# Patient Record
Sex: Female | Born: 1950 | Race: White | Hispanic: No | Marital: Single
Health system: Midwestern US, Community
[De-identification: ages and names within clinical notes are randomized; demographics above are authoritative.]

## PROBLEM LIST (undated history)

## (undated) DIAGNOSIS — S83209A Unspecified tear of unspecified meniscus, current injury, unspecified knee, initial encounter: Secondary | ICD-10-CM

## (undated) DIAGNOSIS — G4730 Sleep apnea, unspecified: Secondary | ICD-10-CM

## (undated) DIAGNOSIS — G629 Polyneuropathy, unspecified: Secondary | ICD-10-CM

## (undated) DIAGNOSIS — T148XXA Other injury of unspecified body region, initial encounter: Secondary | ICD-10-CM

## (undated) DIAGNOSIS — R251 Tremor, unspecified: Secondary | ICD-10-CM

## (undated) HISTORY — PX: ABDOMINAL SURGERY: SHX537

## (undated) HISTORY — DX: Tremor, unspecified: R25.1

## (undated) HISTORY — PX: KIDNEY STONE SURGERY: SHX686

## (undated) HISTORY — DX: Sleep apnea, unspecified: G47.30

## (undated) HISTORY — DX: Other injury of unspecified body region, initial encounter: T14.8XXA

## (undated) HISTORY — DX: Unspecified tear of unspecified meniscus, current injury, unspecified knee, initial encounter: S83.209A

## (undated) HISTORY — DX: Polyneuropathy, unspecified: G62.9

---

## 2015-06-15 NOTE — ED Provider Notes (Signed)
PATIENT:          Wendy Middleton, Wendy Middleton       DOS:           06/15/2015  MR #:             [id number]             ACCOUNT #:     [id number]  DATE OF BIRTH:    September 12, 1951              AGE:           63      HISTORY OF PRESENT ILLNESS:    PERTINENT HISTORY OF PRESENT  ILLNESS. Patient presents with a  chief  complaint of decreased hearing in both her ears, she thinks that she  has wax  impacted in her ears.  She tried debrox drops earlier today.  [place] and has been traveling for a golf tournament and that is why  she is in  this area.    PERTINENT PAST/ FAMILY/SOCIAL HISTORY Essential tremor        PHYSICAL EXAM General: No acute distress, vital signs within normal  limits,  afebrile  HEENT: Normocephalic and atraumatic, pupils are equal, round and  reactive to  light, EOMI, moist mucous membranes, bilateral ears are impacted with  cerumen  CVS: Regular rate and rhythm, S1-S2  Respiratory: CTA bilaterally, no distress  Abdomen: Soft, nontender, nondistended, normal bowel sounds  Extremities: 2+ DP PT pulses bilaterally and symmetrically, nontender,  no edema  neuro: Alert and oriented x3, no focal deficits, patient does have an  essential  tremor in bilateral upper extremities    MEDICAL DECISION MAKING:    SIGNIFICANT FINDINGS/ED COURSE/MEDICAL DECISION MAKING/TREATMENT  PLAN The  cerumen impaction was removed from the left ear without any difficulty  and  patient had complete relief of her symptoms on the side.  On the right  side,  the cerumen was much harder in nature, and irrigation was performed,  most of it  was removed with a curette.  Patient had relief of her symptoms.  She  is not  from this area, she stated that when she gets back home she will  follow-up with  her doctor.  I instructed her to use debrox drops as well as needed  she is  agreeable with this plan and she was discharged in stable condition.    PROBLEM LIST:       Admit Reason:     Bilat Ear Pain: Entered Date:  15-Jun-2015 16:55, Entered By:  Standley BrookingINTERFACES,  INTERFACES, Status: Active        DIAGNOSIS Bilateral cerumen impaction      COPIES SENT TO::     NO, PCP DOCTOR(PCP): [id number]    Electronic Signatures:  Hildred AlaminBILGEN, Ashlyne Olenick (MD)  (Signed 15-Jun-2015 17:43)   Authored: HISTORY OF PRESENT ILLNESS, PHYSICAL EXAM, MEDICAL DECISION  MAKING,  PROBLEM LIST, DIAGNOSIS, Copies to be sent to:      Last Updated: 15-Jun-2015 17:43 by Hildred AlaminBILGEN, Aries Kasa (MD)            Please see T-Sheet, initial assessment, and physician orders for  further details.    Dictating Physician: Hildred Alaminzlem Telina Kleckley, MD  Original Electronic Signature Date: 06/15/2015 05:43 P  OB  Document #: [id number]    cc:  PCP No       [place]

## 2015-12-20 ENCOUNTER — Other Ambulatory Visit (HOSPITAL_COMMUNITY)
Admission: RE | Admit: 2015-12-20 | Discharge: 2015-12-20 | Disposition: A | Discharge status: Home / Self Care | Payer: Commercial Managed Care - PPO | Source: Ambulatory Visit | Attending: Family Medicine | Admitting: Family Medicine

## 2015-12-20 ENCOUNTER — Other Ambulatory Visit: Payer: Self-pay | Admitting: Family Medicine

## 2015-12-20 DIAGNOSIS — Z01411 Encounter for gynecological examination (general) (routine) with abnormal findings: Secondary | ICD-10-CM | POA: Diagnosis present

## 2015-12-20 DIAGNOSIS — Z1151 Encounter for screening for human papillomavirus (HPV): Secondary | ICD-10-CM | POA: Insufficient documentation

## 2015-12-23 LAB — CYTOLOGY - PAP

## 2016-01-03 ENCOUNTER — Other Ambulatory Visit: Payer: Self-pay | Admitting: Obstetrics and Gynecology

## 2016-01-23 ENCOUNTER — Other Ambulatory Visit: Payer: Self-pay | Admitting: Obstetrics and Gynecology

## 2016-07-04 ENCOUNTER — Other Ambulatory Visit: Payer: Self-pay | Admitting: Obstetrics and Gynecology

## 2016-07-04 ENCOUNTER — Other Ambulatory Visit (HOSPITAL_COMMUNITY)
Admission: RE | Admit: 2016-07-04 | Discharge: 2016-07-04 | Disposition: A | Discharge status: Home / Self Care | Payer: Commercial Managed Care - PPO | Source: Ambulatory Visit | Attending: Obstetrics and Gynecology | Admitting: Obstetrics and Gynecology

## 2016-07-04 DIAGNOSIS — Z01411 Encounter for gynecological examination (general) (routine) with abnormal findings: Secondary | ICD-10-CM | POA: Insufficient documentation

## 2016-07-04 DIAGNOSIS — Z1151 Encounter for screening for human papillomavirus (HPV): Secondary | ICD-10-CM | POA: Insufficient documentation

## 2016-07-09 LAB — CYTOLOGY - PAP

## 2016-12-26 ENCOUNTER — Encounter: Payer: Self-pay | Admitting: Diagnostic Neuroimaging

## 2016-12-26 ENCOUNTER — Ambulatory Visit (INDEPENDENT_AMBULATORY_CARE_PROVIDER_SITE_OTHER): Payer: Commercial Managed Care - PPO | Admitting: Diagnostic Neuroimaging

## 2016-12-26 VITALS — BP 103/74 | HR 79 | Ht 68.0 in | Wt 210.0 lb

## 2016-12-26 DIAGNOSIS — G250 Essential tremor: Secondary | ICD-10-CM | POA: Diagnosis not present

## 2016-12-26 MED ORDER — PRIMIDONE 50 MG PO TABS: 25.0000 mg | ORAL_TABLET | Freq: Two times a day (BID) | ORAL | 6 refills | Status: DC

## 2016-12-26 NOTE — Progress Notes (Signed)
[facility]  PATIENT: Susan Norton DOB: Mar 04, 1951  REFERRING CLINICIAN: Edwin Dada HISTORY FROM: patient  REASON FOR VISIT: new consult    HISTORICAL  CHIEF COMPLAINT:  Chief Complaint  Patient presents with  . Tremors    rm 6, New Pt, "tremors in hands x 20 years, runs in my family"    HISTORY OF PRESENT ILLNESS:   66 year old female here for evaluation of tremor. Patient has had postural and action tremor since age 77 years old. Patient's father had some tremor and several family members on her father's side had tremor. Patient was diagnosed with possible essential tremor. Over the last few years symptoms have slightly worsened. She denies any voice tremor or leg tremor. Tremor mainly affects her hands. Symptoms are worse when she is tired or nervous. This affects her when she is reaching for things, holding utensils or handwriting. Patient tried propranolol 40 mg daily for 1-2 months without relief. No seeming in 5 pexied up. Patient has normal blood pressure but tends to run slightly on the low side.    REVIEW OF SYSTEMS: Full 14 system review of systems performed and negative with exception of: History of sleep apnea. Knee arthritis and torn meniscus.  ALLERGIES: No Known Allergies  HOME MEDICATIONS: No outpatient prescriptions prior to visit.   No facility-administered medications prior to visit.     PAST MEDICAL HISTORY: Past Medical History:  Diagnosis Date  . Sleep apnea    CPAP  . Torn meniscus    both knees    PAST SURGICAL HISTORY: Past Surgical History:  Procedure Laterality Date  . ABDOMINAL SURGERY     small growth removed  . KIDNEY STONE SURGERY      FAMILY HISTORY: Family History  Problem Relation Age of Onset  . Atrial fibrillation Mother   . Cancer Mother 87    breast  . Stroke Mother     x 8  . Hypertension Mother   . Cancer Father     prostate    SOCIAL HISTORY:  Social History   Social History  . Marital  status: Single    Spouse name: N/A  . Number of children: 0  . Years of education: 72   Occupational History  .      Architect   Social History Main Topics  . Smoking status: Never Smoker  . Smokeless tobacco: Never Used  . Alcohol use Yes     Comment: 12/26/16 1-2 glasses wine/night  . Drug use: No  . Sexual activity: Not on file   Other Topics Concern  . Not on file   Social History Narrative   Lives alone   No caffeine     PHYSICAL EXAM  GENERAL EXAM/CONSTITUTIONAL: Vitals:  Vitals:   12/26/16 1145  BP: 103/74  Pulse: 79  Weight: 210 lb (95.3 kg)  Height: 5\' 8"  (1.727 m)     Body mass index is 31.93 kg/m.  Visual Acuity Screening   Right eye Left eye Both eyes  Without correction:     With correction: 20/30 20/30      Patient is in no distress; well developed, nourished and groomed; neck is supple  CARDIOVASCULAR:  Examination of carotid arteries is normal; no carotid bruits  Regular rate and rhythm, no murmurs  Examination of peripheral vascular system by observation and palpation is normal  EYES:  Ophthalmoscopic exam of optic discs and posterior segments is normal; no papilledema or hemorrhages  MUSCULOSKELETAL:  Gait, strength,  tone, movements noted in Neurologic exam below  NEUROLOGIC: MENTAL STATUS:  No flowsheet data found.  awake, alert, oriented to person, place and time  recent and remote memory intact  normal attention and concentration  language fluent, comprehension intact, naming intact,   fund of knowledge appropriate  CRANIAL NERVE:   2nd - no papilledema on fundoscopic exam  2nd, 3rd, 4th, 6th - pupils equal and reactive to light, visual fields full to confrontation, extraocular muscles intact, no nystagmus  5th - facial sensation symmetric  7th - facial strength symmetric  8th - hearing intact  9th - palate elevates symmetrically, uvula midline  11th - shoulder shrug symmetric  12th - tongue  protrusion midline  MOTOR:   POSTURAL AND ACTION TREMOR IN BUE  MILD HEAD TREMOR  normal bulk and tone, full strength in the BUE, BLE  SENSORY:   normal and symmetric to light touch, temperature, vibration  COORDINATION:   finger-nose-finger, fine finger movements --> INTENTION TREMOR IN BUE  REFLEXES:   deep tendon reflexes present and symmetric  GAIT/STATION:   narrow based gait; SLIGHT DIFF WITH TANDEM; romberg is negative    DIAGNOSTIC DATA (LABS, IMAGING, TESTING) - I reviewed patient records, labs, notes, testing and imaging myself where available.  No results found for: WBC, HGB, HCT, MCV, PLT No results found for: NA, K, CL, CO2, GLUCOSE, BUN, CREATININE, CALCIUM, PROT, ALBUMIN, AST, ALT, ALKPHOS, BILITOT, GFRNONAA, GFRAA No results found for: CHOL, HDL, LDLCALC, LDLDIRECT, TRIG, CHOLHDL No results found for: HGBA1C No results found for: VITAMINB12 No results found for: TSH     ASSESSMENT AND PLAN  66 y.o. year old female here with postural and action tremor of bilateral upper extremity since age 58 years old, with very slow, mild progression over more than 40 years, most consistent with diagnosis of essential tremor. Patient tried low-dose propranolol without benefit. Due to low blood pressure, we'll try primidone instead.   Dx:  1. Essential tremor      PLAN: - trial of primidone 25mg  daily; may increase to 25mg  twice a day after 1-2 weeks; then may increase to 50mg  twice a day  Return in about 3 months (around 03/26/2017).    Penni Bombard, MD [id number], [id number] PM Certified in Neurology, Neurophysiology and Neuroimaging  [facility] [address] [place], [place] [address] [phone]

## 2016-12-26 NOTE — Patient Instructions (Signed)
Thank you for coming to see [place] at [facility]. I hope we have been able to provide you high quality care today.  You may receive a patient satisfaction survey over the next few weeks. We would appreciate your feedback and comments so that we may continue to improve ourselves and the health of our patients.  - trial of primidone 87m daily; may increase to 226mtwice a day after 1-2 weeks; then may increase to 5056mwice a day   ~~~~~~~~~~~~~~~~~~~~~~~~~~~~~~~~~~~~~~~~~~~~~~~~~~~~~~~~~~~~~~~~~  DR. Jahid Weida'S GUIDE TO HAPPY AND HEALTHY LIVING These are some of my general health and wellness recommendations. Some of them may apply to you better than others. Please use common sense as you try these suggestions and feel free to ask me any questions.   ACTIVITY/FITNESS Mental, social, emotional and physical stimulation are very important for brain and body health. Try learning a new activity (arts, music, language, sports, games).  Keep moving your body to the best of your abilities. You can do this at home, inside or outside, the park, community center, gym or anywhere you like. Consider a physical therapist or personal trainer to get started. Consider the app Sworkit. Fitness trackers such as smart-watches, smart-phones or Fitbits can help as well.   NUTRITION Eat more plants: colorful vegetables, nuts, seeds and berries.  Eat less sugar, salt, preservatives and processed foods.  Avoid toxins such as cigarettes and alcohol.  Drink water when you are thirsty. Warm water with a slice of lemon is an excellent morning drink to start the day.  Consider these websites for more information The Nutrition Source (htthttps://www.henry-hernandez.biz/recision Nutrition (wwwWindowBlog.ch RELAXATION Consider practicing mindfulness meditation or other relaxation techniques such as deep breathing, prayer, yoga, tai chi, massage. See  website mindful.org or the apps Headspace or Calm to help get started.   SLEEP Try to get at least 7-8+ hours sleep per day. Regular exercise and reduced caffeine will help you sleep better. Practice good sleep hygeine techniques. See website sleep.org for more information.   PLANNING Prepare estate planning, living will, healthcare POA documents. Sometimes this is best planned with the help of an attorney. Theconversationproject.org and agingwithdignity.org are excellent resources.

## 2017-01-22 ENCOUNTER — Other Ambulatory Visit: Payer: Self-pay | Admitting: Obstetrics and Gynecology

## 2017-01-22 ENCOUNTER — Other Ambulatory Visit (HOSPITAL_COMMUNITY)
Admission: RE | Admit: 2017-01-22 | Discharge: 2017-01-22 | Disposition: A | Discharge status: Home / Self Care | Payer: Commercial Managed Care - PPO | Source: Ambulatory Visit | Attending: Obstetrics and Gynecology | Admitting: Obstetrics and Gynecology

## 2017-01-22 DIAGNOSIS — Z1151 Encounter for screening for human papillomavirus (HPV): Secondary | ICD-10-CM | POA: Insufficient documentation

## 2017-01-22 DIAGNOSIS — Z01419 Encounter for gynecological examination (general) (routine) without abnormal findings: Secondary | ICD-10-CM | POA: Insufficient documentation

## 2017-01-24 LAB — CYTOLOGY - PAP
DIAGNOSIS: NEGATIVE
HPV (WINDOPATH): NOT DETECTED

## 2017-03-04 ENCOUNTER — Telehealth: Payer: Self-pay | Admitting: *Deleted

## 2017-03-04 NOTE — Telephone Encounter (Signed)
Spoke with patient and advised her that her follow up needs to be rescheduled due to dr unavailable. Rescheduled for next day. Patient then asked what maximum dose of primidone she could take. She remembered Dr Leta Baptist discussing increasing it if it was not effective. Per Dr Gladstone Lighter note, advised she may take up to 50 mg twice daily. She stated she would do that. Advised she can let D Penumalli know the effectiveness at her follow up. She verbalized understanding, appreciation.

## 2017-04-03 ENCOUNTER — Encounter: Payer: Self-pay | Admitting: Podiatry

## 2017-04-03 ENCOUNTER — Ambulatory Visit (INDEPENDENT_AMBULATORY_CARE_PROVIDER_SITE_OTHER): Payer: Commercial Managed Care - PPO

## 2017-04-03 ENCOUNTER — Ambulatory Visit (INDEPENDENT_AMBULATORY_CARE_PROVIDER_SITE_OTHER): Payer: Commercial Managed Care - PPO | Admitting: Podiatry

## 2017-04-03 DIAGNOSIS — M25472 Effusion, left ankle: Secondary | ICD-10-CM | POA: Diagnosis not present

## 2017-04-03 DIAGNOSIS — M79671 Pain in right foot: Secondary | ICD-10-CM

## 2017-04-03 DIAGNOSIS — M79672 Pain in left foot: Secondary | ICD-10-CM

## 2017-04-03 DIAGNOSIS — M7741 Metatarsalgia, right foot: Secondary | ICD-10-CM | POA: Diagnosis not present

## 2017-04-03 DIAGNOSIS — M7742 Metatarsalgia, left foot: Secondary | ICD-10-CM

## 2017-04-03 DIAGNOSIS — M25471 Effusion, right ankle: Secondary | ICD-10-CM

## 2017-04-03 DIAGNOSIS — M2041 Other hammer toe(s) (acquired), right foot: Secondary | ICD-10-CM | POA: Diagnosis not present

## 2017-04-03 DIAGNOSIS — M2042 Other hammer toe(s) (acquired), left foot: Secondary | ICD-10-CM | POA: Diagnosis not present

## 2017-04-03 DIAGNOSIS — M2040 Other hammer toe(s) (acquired), unspecified foot: Secondary | ICD-10-CM

## 2017-04-04 NOTE — Progress Notes (Signed)
   Subjective: Patient is a 66 year old female presenting today as a new patient with a complaint of gradual onset pain to the plantar aspect of rhe right foot that has been present for the past two months. She also reports bilateral hammertoes. Walking increases her pain. She has not done anything to treat her symptoms.    Objective: Physical Exam General: The patient is alert and oriented x3 in no acute distress.  Dermatology: Skin is cool, dry and supple bilateral lower extremities. Negative for open lesions or macerations.  Vascular: Palpable pedal pulses bilaterally. No edema or erythema noted. Capillary refill within normal limits.  Neurological: Epicritic and protective threshold grossly intact bilaterally.   Musculoskeletal Exam: All pedal and ankle joints range of motion within normal limits bilateral. Muscle strength 5/5 in all groups bilateral. Hammertoe contracture deformity noted to digits 2-5 of the bilateral feet. Moderate edema noted to bilateral feet and ankles.   Radiographic Exam: Hammertoe contracture deformity noted to the interphalangeal joints and MPJ of the respective hammertoe digits mentioned on clinical musculoskeletal exam.     Assessment: #1 Hammertoes 2-5 bilaterally #2 metatarsalgia bilaterally #3 bilateral foot and ankle edema   Plan of Care:  #1 Patient was evaluated. #2 metatarsal pads dispensed #3 bilateral compression anklets dispensed #4 return to clinic when necessary  Edrick Kins, DPM [facility]  Dr. Edrick Kins, [place]                                        [place], [place] [address]                Office [phone]  Fax [phone]

## 2017-04-08 ENCOUNTER — Ambulatory Visit: Payer: Commercial Managed Care - PPO | Admitting: Diagnostic Neuroimaging

## 2017-04-09 ENCOUNTER — Ambulatory Visit (INDEPENDENT_AMBULATORY_CARE_PROVIDER_SITE_OTHER): Payer: Commercial Managed Care - PPO | Admitting: Diagnostic Neuroimaging

## 2017-04-09 ENCOUNTER — Encounter: Payer: Self-pay | Admitting: Diagnostic Neuroimaging

## 2017-04-09 VITALS — BP 118/74 | HR 80 | Ht 68.0 in | Wt 213.2 lb

## 2017-04-09 DIAGNOSIS — G250 Essential tremor: Secondary | ICD-10-CM | POA: Diagnosis not present

## 2017-04-09 MED ORDER — PRIMIDONE 50 MG PO TABS: 100.0000 mg | ORAL_TABLET | Freq: Two times a day (BID) | ORAL | 4 refills | Status: DC

## 2017-04-09 NOTE — Patient Instructions (Signed)
-    Increase primidone to 100mg  twice a day

## 2017-04-09 NOTE — Progress Notes (Signed)
[facility]  PATIENT: Susan Norton DOB: 04/18/1951  REFERRING CLINICIAN: Edwin Dada HISTORY FROM: patient  REASON FOR VISIT: follow up   HISTORICAL  CHIEF COMPLAINT:  Chief Complaint  Patient presents with  . Follow-up  . essential tremor    no better on primidone 50mg  po bid    HISTORY OF PRESENT ILLNESS:   UPDATE 04/09/17: Since last visit, doing about the same. On primidone 50mg  BID. No side effects. Tremor mainly affects pt when she is measuring with cooking or applying makeup.   PRIOR HPI (12/26/16): 66 year old female here for evaluation of tremor. Patient has had postural and action tremor since age 26 years old. Patient's father had some tremor and several family members on her father's side had tremor. Patient was diagnosed with possible essential tremor. Over the last few years symptoms have slightly worsened. She denies any voice tremor or leg tremor. Tremor mainly affects her hands. Symptoms are worse when she is tired or nervous. This affects her when she is reaching for things, holding utensils or handwriting. Patient tried propranolol 40 mg daily for 1-2 months without relief. Patient has normal blood pressure but tends to run slightly on the low side.   REVIEW OF SYSTEMS: Full 14 system review of systems performed and negative with exception of: tremors joint pain.   ALLERGIES: No Known Allergies  HOME MEDICATIONS: Outpatient Medications Prior to Visit  Medication Sig Dispense Refill  . Calcium Carb-Cholecalciferol (CALCIUM 1000 + D PO) Take 2,000 Units by mouth.    . Cholecalciferol (VITAMIN D3) 3000 units TABS Take by mouth.    Marland Kitchen KRILL OIL PO Take by mouth.    . primidone (MYSOLINE) 50 MG tablet Take 0.5-1 tablets (25-50 mg total) by mouth 2 (two) times daily. 60 tablet 6  . ranitidine (ZANTAC) 150 MG tablet Take 150 mg by mouth as needed for heartburn.     No facility-administered medications prior to visit.     PAST MEDICAL  HISTORY: Past Medical History:  Diagnosis Date  . Sleep apnea    CPAP  . Torn meniscus    both knees    PAST SURGICAL HISTORY: Past Surgical History:  Procedure Laterality Date  . ABDOMINAL SURGERY     small growth removed  . KIDNEY STONE SURGERY      FAMILY HISTORY: Family History  Problem Relation Age of Onset  . Atrial fibrillation Mother   . Cancer Mother 59    breast  . Stroke Mother     x 8  . Hypertension Mother   . Cancer Father     prostate    SOCIAL HISTORY:  Social History   Social History  . Marital status: Single    Spouse name: N/A  . Number of children: 0  . Years of education: 67   Occupational History  .      Architect   Social History Main Topics  . Smoking status: Never Smoker  . Smokeless tobacco: Never Used  . Alcohol use Yes     Comment: 12/26/16 1-2 glasses wine/night  . Drug use: No  . Sexual activity: Not on file   Other Topics Concern  . Not on file   Social History Narrative   Lives alone   No caffeine     PHYSICAL EXAM  GENERAL EXAM/CONSTITUTIONAL: Vitals:  Vitals:   04/09/17 0859  BP: 118/74  Pulse: 80  Weight: 213 lb 3.2 oz (96.7 kg)  Height: 5\' 8"  (1.727 m)  Body mass index is 32.42 kg/m. No exam data present  Patient is in no distress; well developed, nourished and groomed; neck is supple  CARDIOVASCULAR:  Examination of carotid arteries is normal; no carotid bruits  Regular rate and rhythm, no murmurs  Examination of peripheral vascular system by observation and palpation is normal  EYES:  Ophthalmoscopic exam of optic discs and posterior segments is normal; no papilledema or hemorrhages  MUSCULOSKELETAL:  Gait, strength, tone, movements noted in Neurologic exam below  NEUROLOGIC: MENTAL STATUS:  No flowsheet data found.  awake, alert, oriented to person, place and time  recent and remote memory intact  normal attention and concentration  language fluent, comprehension  intact, naming intact,   fund of knowledge appropriate  CRANIAL NERVE:   2nd - no papilledema on fundoscopic exam  2nd, 3rd, 4th, 6th - pupils equal and reactive to light, visual fields full to confrontation, extraocular muscles intact, no nystagmus  5th - facial sensation symmetric  7th - facial strength symmetric  8th - hearing intact  9th - palate elevates symmetrically, uvula midline  11th - shoulder shrug symmetric  12th - tongue protrusion midline  MOTOR:   POSTURAL < ACTION TREMOR IN BUE  NO RESTING TREMOR  NO COGWHEELING RIGIDITY  MILD BRADYKINESIA IN LUE > RUE  normal bulk and tone, full strength in the BUE, BLE  SENSORY:   normal and symmetric to light touch, temperature, vibration  COORDINATION:   finger-nose-finger, fine finger movements --> INTENTION TREMOR IN BUE  REFLEXES:   deep tendon reflexes present and symmetric  GAIT/STATION:   narrow based gait; NORMAL ARM SWING    DIAGNOSTIC DATA (LABS, IMAGING, TESTING) - I reviewed patient records, labs, notes, testing and imaging myself where available.  No results found for: WBC, HGB, HCT, MCV, PLT No results found for: NA, K, CL, CO2, GLUCOSE, BUN, CREATININE, CALCIUM, PROT, ALBUMIN, AST, ALT, ALKPHOS, BILITOT, GFRNONAA, GFRAA No results found for: CHOL, HDL, LDLCALC, LDLDIRECT, TRIG, CHOLHDL No results found for: HGBA1C No results found for: VITAMINB12 No results found for: TSH     ASSESSMENT AND PLAN  66 y.o. year old female here with postural and action tremor of bilateral upper extremity since age 61 years old, with very slow, mild progression over more than 40 years, most consistent with diagnosis of essential tremor. Patient tried low-dose propranolol without benefit. Now on primidone 50mg  BID without relief.   Dx:  1. Essential tremor      PLAN: I spent 15 minutes of face to face time with patient. Greater than 50% of time was spent in counseling and coordination of care  with patient. In summary we discussed:  - increase primidone to 100mg  twice a day - in future may further increase primidone - in future may add propranolol - discussed deep brain stimulation options for the future if needed  Meds ordered this encounter  Medications  . primidone (MYSOLINE) 50 MG tablet    Sig: Take 2 tablets (100 mg total) by mouth 2 (two) times daily.    Dispense:  360 tablet    Refill:  4   Return in about 4 months (around 08/09/2017).    Penni Bombard, MD 03/15/5187, 4:16 AM Certified in Neurology, Neurophysiology and Neuroimaging  [facility] [address] [place], [place] [address] [phone]

## 2017-06-11 ENCOUNTER — Telehealth: Payer: Self-pay | Admitting: *Deleted

## 2017-06-11 DIAGNOSIS — M7671 Peroneal tendinitis, right leg: Secondary | ICD-10-CM

## 2017-06-11 NOTE — Telephone Encounter (Addendum)
Pt states 2 months ago Dr. Amalia Hailey had treated for a pain in the ball of her foot and offered orthotics, since then she has had a broken foot, and would like to have the orthotics made. 06/11/2017-I offered pt an appt and she states she doesn't want to pay another co-pay for an office visit. I told pt that I could transfer her to be scheduled with our pedorthist, but I felt she would benefit from being reevaluated due to the fracture in her foot. Pt then agreed and I transferred to the schedulers for an appt with Dr. Amalia Hailey.07/01/2017-Orders for MRI right foot without contrast given to D. Meadows, faxed to [place].

## 2017-07-01 ENCOUNTER — Ambulatory Visit (INDEPENDENT_AMBULATORY_CARE_PROVIDER_SITE_OTHER): Payer: Commercial Managed Care - PPO | Admitting: Podiatry

## 2017-07-01 ENCOUNTER — Ambulatory Visit (INDEPENDENT_AMBULATORY_CARE_PROVIDER_SITE_OTHER): Payer: Commercial Managed Care - PPO

## 2017-07-01 DIAGNOSIS — M7671 Peroneal tendinitis, right leg: Secondary | ICD-10-CM

## 2017-07-01 DIAGNOSIS — M79671 Pain in right foot: Secondary | ICD-10-CM

## 2017-07-01 NOTE — Progress Notes (Signed)
   HPI: Patient presents today for evaluation of what she believes is a metatarsal fracture to the right foot. Patient states that [place] when she injured her foot. Patient went to a physician there and was diagnosed with a fifth metatarsal fracture. Patient [place]opedic surgeon. Patient was immobilized for 6 weeks and immobilization cam boot. The patient still continues to have significant pain and tenderness to the right foot during ambulation.   Physical Exam: General: The patient is alert and oriented x3 in no acute distress.  Dermatology: Skin is warm, dry and supple bilateral lower extremities. Negative for open lesions or macerations.  Vascular: Palpable pedal pulses bilaterally. No edema or erythema noted. Capillary refill within normal limits.  Neurological: Epicritic and protective threshold grossly intact bilaterally.   Musculoskeletal Exam: Pain on palpation noted along the peroneal tendons right lower extremity. There is negative pain on palpation along the fifth metatarsal. Range of motion within normal limits to all pedal and ankle joints bilateral. Muscle strength 5/5 in all groups bilateral.   Radiographic Exam:  Normal osseous mineralization. Joint spaces preserved. No fracture/dislocation/boney destruction identified today.  Assessment: 1. Peroneal tendinitis right lower extremity 2. Possible peroneal tendon tear right lower extremity  Plan of Care:  1. Patient was evaluated. X-rays reviewed today 2. Today were going to order an MRI of the right lower extremity to rule out peroneal tendon tear 3. Continued immobilization cam boot when necessary 4. Return to clinic in 3 weeks review results   Edrick Kins, DPM [facility]  Dr. Edrick Kins, DPM    [address], [place] [address]                Office [phone]  Fax [phone]

## 2017-07-01 NOTE — Telephone Encounter (Signed)
-----   Message from Edrick Kins, DPM sent at 07/01/2017 12:28 PM EDT ----- Please order MRI with or without contrast right foot.  Dx: Possible peroneal tendon tear right. Peroneal tendinitis right.   Thanks, Dr. Amalia Hailey

## 2017-07-12 ENCOUNTER — Ambulatory Visit
Admission: RE | Admit: 2017-07-12 | Discharge: 2017-07-12 | Disposition: A | Discharge status: Home / Self Care | Payer: Commercial Managed Care - PPO | Source: Ambulatory Visit | Attending: Podiatry | Admitting: Podiatry

## 2017-07-22 ENCOUNTER — Encounter: Payer: Self-pay | Admitting: Podiatry

## 2017-07-22 ENCOUNTER — Ambulatory Visit (INDEPENDENT_AMBULATORY_CARE_PROVIDER_SITE_OTHER): Payer: No Typology Code available for payment source | Admitting: Podiatry

## 2017-07-22 DIAGNOSIS — S86311D Strain of muscle(s) and tendon(s) of peroneal muscle group at lower leg level, right leg, subsequent encounter: Secondary | ICD-10-CM

## 2017-07-22 NOTE — Progress Notes (Signed)
   HPI: Patient presents today for follow-up treatment and evaluation of peroneal tendinitis to the right lower extremity. Last visit patient was suspicious for possible peroneal tendon tear and MRI was ordered. Patient presents today follow-up treatment and evaluation and to review MRI results. Patient states that her pain continues with fluctuations in intensity. Pain is localized to the lateral aspect of the patient's right ankle   Physical Exam: General: The patient is alert and oriented x3 in no acute distress.  Dermatology: Skin is warm, dry and supple bilateral lower extremities. Negative for open lesions or macerations.  Vascular: Palpable pedal pulses bilaterally. No edema or erythema noted. Capillary refill within normal limits.  Neurological: Epicritic and protective threshold grossly intact bilaterally.   Musculoskeletal Exam: Pain on palpation noted along the peroneal tendons right lower extremity. There is negative pain on palpation along the fifth metatarsal. Range of motion within normal limits to all pedal and ankle joints bilateral. Muscle strength 5/5 in all groups bilateral.   MRI Impression:  1. Focal tenosynovitis, tendinitis, and longitudinal partial split tear of the peroneus longus tendon at the level of the peroneal tubercle with inflammation in around the tubercle. 2. No other significant abnormalities.  Assessment: 1. Peroneal tendinitis right lower extremity 2. Possible peroneal tendon tear right lower extremity  Plan of Care:  1. Patient was evaluated. MRI was reviewed today 2. Today we discussed conservative versus surgical management regarding the peroneal tendon tear. The patient has had this painful ongoing condition for approximately 3 months now. Patient was immobilized for 6 weeks of immobilization cam boot to continues to have significant pain. Today were going to recommend surgical intervention. 3. I did explain all possible complications and details  regarding this procedure. The patient is not a position currently to undergo surgery. She would like to undergo surgery beginning after October first when her insurance changes to Medicare.  4. Return to clinic after October 1 for surgical consultation. In the meantime, continue wearing good supportive shoe gear and cam boot when necessary  Edrick Kins, DPM [facility]  Dr. Edrick Kins, DPM    [address], [place] [address]                Office [phone]  Fax [phone]

## 2017-08-12 ENCOUNTER — Ambulatory Visit: Payer: Commercial Managed Care - PPO | Admitting: Diagnostic Neuroimaging

## 2017-09-16 ENCOUNTER — Ambulatory Visit: Payer: No Typology Code available for payment source | Admitting: Podiatry

## 2017-12-17 DIAGNOSIS — T148XXA Other injury of unspecified body region, initial encounter: Secondary | ICD-10-CM

## 2017-12-17 HISTORY — DX: Other injury of unspecified body region, initial encounter: T14.8XXA

## 2018-02-03 ENCOUNTER — Ambulatory Visit: Payer: Medicare Other | Admitting: Podiatry

## 2018-02-03 DIAGNOSIS — G609 Hereditary and idiopathic neuropathy, unspecified: Secondary | ICD-10-CM | POA: Diagnosis not present

## 2018-02-03 DIAGNOSIS — S86311D Strain of muscle(s) and tendon(s) of peroneal muscle group at lower leg level, right leg, subsequent encounter: Secondary | ICD-10-CM

## 2018-02-03 MED ORDER — NONFORMULARY OR COMPOUNDED ITEM: 2 refills | Status: DC

## 2018-02-05 NOTE — Progress Notes (Signed)
   HPI: 67 year old female presenting today for follow up evaluation of bilateral foot pain. She reports cramping pain in the feet, right worse than left. She states the pain wakes her at night. She states the pain is worse when she is not walking. Patient is here for further evaluation and treatment.   Past Medical History:  Diagnosis Date  . Sleep apnea    CPAP  . Torn meniscus    both knees     Physical Exam: General: The patient is alert and oriented x3 in no acute distress.  Dermatology: Skin is warm, dry and supple bilateral lower extremities. Negative for open lesions or macerations.  Vascular: Palpable pedal pulses bilaterally. No edema or erythema noted. Capillary refill within normal limits.  Neurological: Epicritic and protective threshold grossly intact bilaterally. Paresthesias noted with light touch to soles of feet bilaterally.  Musculoskeletal Exam: Pain with palpation to the peroneal tendons of the right foot. Range of motion within normal limits to all pedal and ankle joints bilateral. Muscle strength 5/5 in all groups bilateral.   MRI Impression:  1. Focal tenosynovitis, tendinitis, and longitudinal partial split tear of the peroneus longus tendon at the level of the peroneal tubercle with inflammation in around the tubercle. 2. No other significant abnormalities.  Assessment: - Idiopathic peripheral neuropathy bilateral feet  - Split tear peroneal tendon right   Plan of Care:  - Patient evaluated.  - Prescription for Neuropathic pain cream to be dispensed from [facility].  - Recommended good shoe gear.  - Continue iontophoresis PT for peroneal tear.  - Return to clinic when necessary.    Edrick Kins, DPM [facility]  Dr. Edrick Kins, DPM    [address], [place] [address]                Office [phone]  Fax [phone]

## 2018-03-10 ENCOUNTER — Other Ambulatory Visit (HOSPITAL_COMMUNITY)
Admission: RE | Admit: 2018-03-10 | Discharge: 2018-03-10 | Disposition: A | Discharge status: Home / Self Care | Payer: Medicare Other | Source: Ambulatory Visit | Attending: Obstetrics and Gynecology | Admitting: Obstetrics and Gynecology

## 2018-03-10 ENCOUNTER — Other Ambulatory Visit: Payer: Self-pay | Admitting: Obstetrics and Gynecology

## 2018-03-10 DIAGNOSIS — D067 Carcinoma in situ of other parts of cervix: Secondary | ICD-10-CM | POA: Insufficient documentation

## 2018-03-13 LAB — CYTOLOGY - PAP
Diagnosis: NEGATIVE
HPV (WINDOPATH): NOT DETECTED

## 2018-03-25 DIAGNOSIS — M25561 Pain in right knee: Secondary | ICD-10-CM | POA: Insufficient documentation

## 2018-04-22 DIAGNOSIS — M7671 Peroneal tendinitis, right leg: Secondary | ICD-10-CM | POA: Insufficient documentation

## 2018-08-21 ENCOUNTER — Telehealth: Payer: Self-pay | Admitting: Diagnostic Neuroimaging

## 2018-08-21 MED ORDER — PRIMIDONE 50 MG PO TABS: 100.0000 mg | ORAL_TABLET | Freq: Two times a day (BID) | ORAL | 0 refills | Status: DC

## 2018-08-21 NOTE — Telephone Encounter (Signed)
LMVM for pt that was checking to see how she is.   Will refill for 90 day supply.

## 2018-08-21 NOTE — Addendum Note (Signed)
Addended by: Brandon Melnick on: 08/21/2018 01:52 PM   Modules accepted: Orders

## 2018-08-21 NOTE — Telephone Encounter (Signed)
Pt has called asking for a refill on her primidone (MYSOLINE) 50 MG tablet to be sent to  [facility] [address] [place] [phone] (Phone) [phone] (Fax)   Pt was reminded that she was supposed to f/u in 07-2017.  Pt has accepted an appointment for 12-15-2018 she is asking for a call to know if she can get a refill, please call

## 2018-11-19 ENCOUNTER — Telehealth: Payer: Self-pay | Admitting: Neurology

## 2018-11-19 MED ORDER — PRIMIDONE 50 MG PO TABS: 100.0000 mg | ORAL_TABLET | Freq: Two times a day (BID) | ORAL | 0 refills | Status: DC

## 2018-11-19 NOTE — Telephone Encounter (Signed)
Primidone refilled x 2 months. Note to pharmacy re: patient needs to keep FU Dec 30,2019.

## 2018-11-19 NOTE — Telephone Encounter (Addendum)
Error in MD name--  Pt requesting refills for primidone (MYSOLINE) 50 MG tablet sent to [facility].

## 2018-12-15 ENCOUNTER — Ambulatory Visit: Payer: Medicare Other | Admitting: Diagnostic Neuroimaging

## 2018-12-15 ENCOUNTER — Encounter: Payer: Self-pay | Admitting: Diagnostic Neuroimaging

## 2018-12-15 ENCOUNTER — Encounter

## 2018-12-15 VITALS — BP 119/81 | HR 79 | Ht 69.0 in | Wt 210.4 lb

## 2018-12-15 DIAGNOSIS — G250 Essential tremor: Secondary | ICD-10-CM | POA: Diagnosis not present

## 2018-12-15 MED ORDER — PRIMIDONE 50 MG PO TABS: 100.0000 mg | ORAL_TABLET | Freq: Two times a day (BID) | ORAL | 4 refills | Status: DC

## 2018-12-15 MED ORDER — PROPRANOLOL HCL 20 MG PO TABS: 20.0000 mg | ORAL_TABLET | Freq: Two times a day (BID) | ORAL | 6 refills | Status: DC

## 2018-12-15 NOTE — Progress Notes (Signed)
[facility]  PATIENT: Susan Norton DOB: [birth year]  REFERRING CLINICIAN: Edwin Dada HISTORY FROM: patient  REASON FOR VISIT: follow up   HISTORICAL  CHIEF COMPLAINT:  Chief Complaint  Patient presents with  . Essential tremor    rm 6, "tremor in hands is worse"  . Follow-up    HISTORY OF PRESENT ILLNESS:   UPDATE (12/15/18, VRP): Since last visit, tremor has progressed in BUE (postural and action). Tolerating primidone 100mg  twice a day. No other alleviating or aggravating factors.    UPDATE 04/09/17: Since last visit, doing about the same. On primidone 50mg  BID. No side effects. Tremor mainly affects pt when she is measuring with cooking or applying makeup.   PRIOR HPI (12/26/16): 67 year old female here for evaluation of tremor. Patient has had postural and action tremor since age 48 years old. Patient's father had some tremor and several family members on her father's side had tremor. Patient was diagnosed with possible essential tremor. Over the last few years symptoms have slightly worsened. She denies any voice tremor or leg tremor. Tremor mainly affects her hands. Symptoms are worse when she is tired or nervous. This affects her when she is reaching for things, holding utensils or handwriting. Patient tried propranolol 40 mg daily for 1-2 months without relief. Patient has normal blood pressure but tends to run slightly on the low side.   REVIEW OF SYSTEMS: Full 14 system review of systems performed and negative with exception of: tremor apnea.  ALLERGIES: No Known Allergies  HOME MEDICATIONS: Outpatient Medications Prior to Visit  Medication Sig Dispense Refill  . Multiple Vitamins-Minerals (MULTIVITAMIN WITH MINERALS) tablet Take 1 tablet by mouth daily.    . primidone (MYSOLINE) 50 MG tablet Take 2 tablets (100 mg total) by mouth 2 (two) times daily. 240 tablet 0  . KRILL OIL PO Take by mouth.    . NONFORMULARY OR COMPOUNDED ITEM [facility]    Peripheral Neuropathy Cream- Bupivacaine 1%, Doxepin 3%, Gabapentin 6%, Pentoxifylline 3%, Topiramate 1% Apply 1-2 grams to affected area 3-4 times daily Qty. 120 gm 3 refills (Patient not taking: Reported on 12/15/2018) 1 each 2  . ranitidine (ZANTAC) 150 MG tablet Take 150 mg by mouth as needed for heartburn.    . Calcium Carb-Cholecalciferol (CALCIUM 1000 + D PO) Take 2,000 Units by mouth.    . Cholecalciferol (VITAMIN D3) 3000 units TABS Take by mouth.     No facility-administered medications prior to visit.     PAST MEDICAL HISTORY: Past Medical History:  Diagnosis Date  . Sleep apnea    CPAP  . Torn meniscus    both knees  . Torn tendon 2019   foot    PAST SURGICAL HISTORY: Past Surgical History:  Procedure Laterality Date  . ABDOMINAL SURGERY     small growth removed  . KIDNEY STONE SURGERY      FAMILY HISTORY: Family History  Problem Relation Age of Onset  . Atrial fibrillation Mother   . Cancer Mother 51       breast  . Stroke Mother        x 8  . Hypertension Mother   . Cancer Father        prostate    SOCIAL HISTORY:  Social History   Socioeconomic History  . Marital status: Single    Spouse name: Not on file  . Number of children: 0  . Years of education: 74  . Highest education level: Not on file  Occupational History    Comment: Architect  Social Needs  . Financial resource strain: Not on file  . Food insecurity:    Worry: Not on file    Inability: Not on file  . Transportation needs:    Medical: Not on file    Non-medical: Not on file  Tobacco Use  . Smoking status: Never Smoker  . Smokeless tobacco: Never Used  Substance and Sexual Activity  . Alcohol use: Yes    Comment: 12/26/16 1-2 glasses wine/night  . Drug use: No  . Sexual activity: Not on file  Lifestyle  . Physical activity:    Days per week: Not on file    Minutes per session: Not on file  . Stress: Not on file  Relationships  . Social connections:     Talks on phone: Not on file    Gets together: Not on file    Attends religious service: Not on file    Active member of club or organization: Not on file    Attends meetings of clubs or organizations: Not on file    Relationship status: Not on file  . Intimate partner violence:    Fear of current or ex partner: Not on file    Emotionally abused: Not on file    Physically abused: Not on file    Forced sexual activity: Not on file  Other Topics Concern  . Not on file  Social History Narrative   Lives alone   No caffeine     PHYSICAL EXAM  GENERAL EXAM/CONSTITUTIONAL: Vitals:  Vitals:   12/15/18 1348  BP: 119/81  Pulse: 79  Weight: 210 lb 6.4 oz (95.4 kg)  Height: 5\' 9"  (1.753 m)   Body mass index is 31.07 kg/m. No exam data present  Patient is in no distress; well developed, nourished and groomed; neck is supple  CARDIOVASCULAR:  Examination of carotid arteries is normal; no carotid bruits  Regular rate and rhythm, no murmurs  Examination of peripheral vascular system by observation and palpation is normal  EYES:  Ophthalmoscopic exam of optic discs and posterior segments is normal; no papilledema or hemorrhages  MUSCULOSKELETAL:  Gait, strength, tone, movements noted in Neurologic exam below  NEUROLOGIC: MENTAL STATUS:  No flowsheet data found.  awake, alert, oriented to person, place and time  recent and remote memory intact  normal attention and concentration  language fluent, comprehension intact, naming intact,   fund of knowledge appropriate  CRANIAL NERVE:   2nd - no papilledema on fundoscopic exam  2nd, 3rd, 4th, 6th - pupils equal and reactive to light, visual fields full to confrontation, extraocular muscles intact, no nystagmus  5th - facial sensation symmetric  7th - facial strength symmetric  8th - hearing intact  9th - palate elevates symmetrically, uvula midline  11th - shoulder shrug symmetric  12th - tongue protrusion  midline  MINIMAL HEAD TREMOR (INTERMITTENT)  MOTOR:   POSTURAL < ACTION TREMOR IN BUE (LUE > RUE)  NO RESTING TREMOR  NO COGWHEELING RIGIDITY  MILD BRADYKINESIA IN LUE > RUE  normal bulk and tone, full strength in the BUE, BLE  SENSORY:   normal and symmetric to light touch, temperature, vibration  COORDINATION:   finger-nose-finger, fine finger movements --> INTENTION TREMOR IN BUE  REFLEXES:   deep tendon reflexes present and symmetric  GAIT/STATION:   narrow based gait; SLIGHT LIMP WITH RIGHT FOOT; SLIGHT DECR RIGHT ARM SWING    DIAGNOSTIC DATA (LABS, IMAGING, TESTING) - I  reviewed patient records, labs, notes, testing and imaging myself where available.  No results found for: WBC, HGB, HCT, MCV, PLT No results found for: NA, K, CL, CO2, GLUCOSE, BUN, CREATININE, CALCIUM, PROT, ALBUMIN, AST, ALT, ALKPHOS, BILITOT, GFRNONAA, GFRAA No results found for: CHOL, HDL, LDLCALC, LDLDIRECT, TRIG, CHOLHDL No results found for: HGBA1C No results found for: VITAMINB12 No results found for: TSH     ASSESSMENT AND PLAN  67 y.o. year old female here with postural and action tremor of bilateral upper extremity since age 34 years old, with very slow, mild progression over more than 40 years, most consistent with diagnosis of essential tremor.    Dx:  No diagnosis found.   PLAN:  ESSENTIAL TREMOR - continue primidone 100mg  twice a day; in future may further increase primidone - start propranolol 20mg  twice a day; may increase in future - discussed deep brain stimulation options for the future if needed  Meds ordered this encounter  Medications  . propranolol (INDERAL) 20 MG tablet    Sig: Take 1 tablet (20 mg total) by mouth 2 (two) times daily.    Dispense:  60 tablet    Refill:  6  . primidone (MYSOLINE) 50 MG tablet    Sig: Take 2 tablets (100 mg total) by mouth 2 (two) times daily.    Dispense:  360 tablet    Refill:  4   Return in about 6 months  (around 06/16/2019).    Penni Bombard, MD 09/38/1829, 9:37 PM Certified in Neurology, Neurophysiology and [facility] [address] [place], [place] [address] [phone]

## 2018-12-15 NOTE — Patient Instructions (Signed)
ESSENTIAL TREMOR - continue primidone 100mg  twice a day; in future may further increase primidone  - start propranolol 20mg  twice a day; may increase in future  - discussed deep brain stimulation options for the future if needed

## 2019-01-02 ENCOUNTER — Telehealth: Payer: Self-pay | Admitting: Diagnostic Neuroimaging

## 2019-01-02 MED ORDER — PROPRANOLOL HCL 40 MG PO TABS: 40.0000 mg | ORAL_TABLET | Freq: Two times a day (BID) | ORAL | 5 refills | Status: DC

## 2019-01-02 NOTE — Addendum Note (Signed)
Addended by: France Ravens I on: 01/02/2019 12:42 PM   Modules accepted: Orders

## 2019-01-02 NOTE — Telephone Encounter (Signed)
Pt is calling to inform that the propranolol (INDERAL) 20 MG tablet is helping but she would like to proceed with the suggested increase by Dr Leta Baptist.  [place] going [place] on [place] and would like the increase in medication before she leaves out of town

## 2019-01-02 NOTE — Telephone Encounter (Signed)
Spoke with pt. and per VP, advised pt. ok to increase Inderal to 40mg  bid. New rx. escribed to [facility]

## 2019-02-03 ENCOUNTER — Telehealth: Payer: Self-pay | Admitting: Diagnostic Neuroimaging

## 2019-02-03 NOTE — Telephone Encounter (Signed)
Spoke with patient and advised her Dr Leta Baptist refilled primidone for one year in Dec 2019. Then advised her she may decrease propranolol to 20 mg as before. She stated that after increasing to 40 mg her symptoms "went back to where they were before she ever started taking it".  She asked if any other medications would help her tremors. I advised Dr Leta Baptist mentioned in note he may consider increasing propranolol in future, however he may want her to stabilize back on 20 mg before making any other medication changes. I advised will let her know if he does make changes. Patient verbalized understanding, appreciation.

## 2019-02-03 NOTE — Telephone Encounter (Signed)
Pt is needing a refill on her primidone (MYSOLINE) 50 MG tablet sent to [facility] Pt also states that her propranolol (INDERAL) 40 MG tablet seemed to do better at the first dosage, pt thinks it was 20mg  than on the now dosage of 40mg . Please advise.

## 2019-02-04 NOTE — Telephone Encounter (Signed)
Stay on propranolol 40mg  if tremors are better controlled on this level. -VRP

## 2019-02-05 NOTE — Telephone Encounter (Signed)
Spoke with patient and gave her Dr Gladstone Lighter reply. She stated she cut back to 20 mg yesterday. She feels that the 40 mg dose did make her tremors worse. I advised if she'd like to stay on 20 mg for one week then call up back with update, that should be fine. She verbalized understanding, appreciation. Marland Kitchen

## 2019-05-20 ENCOUNTER — Encounter: Payer: Self-pay | Admitting: *Deleted

## 2019-05-20 ENCOUNTER — Telehealth: Payer: Self-pay | Admitting: *Deleted

## 2019-05-20 NOTE — Telephone Encounter (Signed)
LVM for patient informing her that Dr Leta Baptist wants to have FU with her before making any medication changes. I advised her that if she agrees and wants a video visit, he has openings next week. Otherwise her June 30th in office appt is still in place. Left office number in case she wants to reschedule for a sooner video visit.Marland Kitchen

## 2019-05-20 NOTE — Telephone Encounter (Signed)
Unable to r/s due to no availability

## 2019-05-20 NOTE — Telephone Encounter (Signed)
Called patient and informed her that due to current COVID 19 pandemic, our office is severely reducing in person visits in order to minimize the risk to our patients and healthcare providers. We recommend to convert your appointment to a video visit. I advised we can move her appt to today if she would like. She stated that she really didn't feel need to move it up, and she had other things going on today. She requested to come into office to be assessed, current appt was Tues June 3th. We rescheduled for that afternoon, and I explained in office check in procedure. She did want me to discuss with Dr Leta Baptist increasing her medication dose for some increase in tremors that comes and goes.  I advised her I will let her know his reply. Patient verbalized understanding, appreciation.

## 2019-05-20 NOTE — Telephone Encounter (Signed)
Pt agrees to a video visit and agrees to move appt to a sooner date  email- [email or web address]  email sent

## 2019-05-20 NOTE — Telephone Encounter (Signed)
pls setup video visit. -VRP

## 2019-05-21 NOTE — Telephone Encounter (Signed)
Spoke with patient and advised her I don't see appt for her. She stated she received e mail but was uncertain if it contained appt date/time. We rescheduled her FU. Her chart has already been updated. E mail resent with date/time. Patient verbalized understanding, appreciation.

## 2019-05-26 ENCOUNTER — Other Ambulatory Visit: Payer: Self-pay

## 2019-05-26 ENCOUNTER — Ambulatory Visit (INDEPENDENT_AMBULATORY_CARE_PROVIDER_SITE_OTHER): Payer: Medicare Other | Admitting: Diagnostic Neuroimaging

## 2019-05-26 ENCOUNTER — Encounter: Payer: Self-pay | Admitting: Diagnostic Neuroimaging

## 2019-05-26 ENCOUNTER — Telehealth: Payer: Self-pay | Admitting: Diagnostic Neuroimaging

## 2019-05-26 DIAGNOSIS — G250 Essential tremor: Secondary | ICD-10-CM

## 2019-05-26 DIAGNOSIS — R251 Tremor, unspecified: Secondary | ICD-10-CM

## 2019-05-26 MED ORDER — PRIMIDONE 50 MG PO TABS: 150.0000 mg | ORAL_TABLET | Freq: Two times a day (BID) | ORAL | 4 refills | Status: DC

## 2019-05-26 MED ORDER — PROPRANOLOL HCL 40 MG PO TABS: 40.0000 mg | ORAL_TABLET | Freq: Two times a day (BID) | ORAL | 5 refills | Status: DC

## 2019-05-26 NOTE — Progress Notes (Signed)
    Virtual Visit via Video Note  I connected with Susan Norton on 05/26/19 at 10:00 AM EDT by a video enabled telemedicine application and verified that I am speaking with the correct person using two identifiers.   I discussed the limitations of evaluation and management by telemedicine and the availability of in person appointments. The patient expressed understanding and agreed to proceed.  Patient is at their home. I am at the office.    History of Present Illness:  - since last visit, tremor progressing; meds not working as well - SBP at home 100-110; no dizziness - tolerating primidone 100mg  .biod and propranolol 40mg  twice a day     Observations/Objective:  - awake, alert - face symm - mild postural tremor in LUE >RUE - no head nor voice tremor - no bradykinesia   Assessment and Plan:  Dx:  1. Tremor   2. Essential tremor    - check MRI brain (rule out other causes) - increase primidone to 150mg  twice a day  - continue propranolol 40mg  twice a day   Orders Placed This Encounter  Procedures  . MR BRAIN W WO CONTRAST   Meds ordered this encounter  Medications  . propranolol (INDERAL) 40 MG tablet    Sig: Take 1 tablet (40 mg total) by mouth 2 (two) times daily.    Dispense:  60 tablet    Refill:  5  . primidone (MYSOLINE) 50 MG tablet    Sig: Take 3 tablets (150 mg total) by mouth 2 (two) times daily.    Dispense:  360 tablet    Refill:  4    Follow Up Instructions:  - Return in about 4 months (around 09/25/2019).   I discussed the assessment and treatment plan with the patient. The patient was provided an opportunity to ask questions and all were answered. The patient agreed with the plan and demonstrated an understanding of the instructions.   The patient was advised to call back or seek an in-person evaluation if the symptoms worsen or if the condition fails to improve as anticipated.  I provided 25 minutes of non-face-to-face time during this  encounter.   Penni Bombard, MD 07/25/3809, 17:51 AM Certified in Neurology, Neurophysiology and Neuroimaging  [facility] [address], [place] at [place], [place] [address] [phone]

## 2019-05-26 NOTE — Telephone Encounter (Signed)
[employer] Medicare order sent to [facility]. No auth they will reach out to the patient to schedule.  °

## 2019-05-27 ENCOUNTER — Ambulatory Visit: Payer: Medicare Other | Admitting: Diagnostic Neuroimaging

## 2019-06-15 ENCOUNTER — Other Ambulatory Visit: Payer: Medicare Other

## 2019-06-16 ENCOUNTER — Ambulatory Visit: Payer: Medicare Other | Admitting: Diagnostic Neuroimaging

## 2019-06-16 ENCOUNTER — Ambulatory Visit: Payer: Self-pay | Admitting: Diagnostic Neuroimaging

## 2019-07-04 ENCOUNTER — Ambulatory Visit
Admission: RE | Admit: 2019-07-04 | Discharge: 2019-07-04 | Disposition: A | Discharge status: Home / Self Care | Payer: Medicare Other | Source: Ambulatory Visit | Attending: Diagnostic Neuroimaging | Admitting: Diagnostic Neuroimaging

## 2019-07-04 ENCOUNTER — Other Ambulatory Visit: Payer: Self-pay

## 2019-07-04 DIAGNOSIS — R251 Tremor, unspecified: Secondary | ICD-10-CM

## 2019-07-04 MED ORDER — GADOBENATE DIMEGLUMINE 529 MG/ML IV SOLN
20.0000 mL | Freq: Once | INTRAVENOUS | Status: AC | PRN
  Administered 2019-07-04: 10:00:00 20 mL via INTRAVENOUS

## 2019-07-06 ENCOUNTER — Telehealth: Payer: Self-pay | Admitting: Neurology

## 2019-07-06 NOTE — Telephone Encounter (Signed)
-----   Message from Anda Latina, RN sent at 07/06/2019  8:39 AM EDT ----- Could you review for Dr. Leta Baptist? Thanks!  ----- Message ----- From: Garvin Fila, MD Sent: 07/04/2019   6:21 PM EDT To: Penni Bombard, MD

## 2019-07-06 NOTE — Telephone Encounter (Signed)
I called the patient.  MRI of the brain was normal.  I discussed this with her.   MRI brain 07/04/19:   IMPRESSION: Unremarkable MRI scan of the brain with and without contrast

## 2019-10-05 ENCOUNTER — Other Ambulatory Visit: Payer: Self-pay

## 2019-10-05 DIAGNOSIS — Z20822 Contact with and (suspected) exposure to covid-19: Secondary | ICD-10-CM

## 2019-10-07 LAB — NOVEL CORONAVIRUS, NAA: SARS-CoV-2, NAA: NOT DETECTED

## 2019-10-13 ENCOUNTER — Other Ambulatory Visit: Payer: Self-pay

## 2019-10-13 DIAGNOSIS — Z20822 Contact with and (suspected) exposure to covid-19: Secondary | ICD-10-CM

## 2019-10-15 LAB — NOVEL CORONAVIRUS, NAA: SARS-CoV-2, NAA: NOT DETECTED

## 2019-11-02 ENCOUNTER — Other Ambulatory Visit: Payer: Self-pay

## 2019-11-02 DIAGNOSIS — Z20822 Contact with and (suspected) exposure to covid-19: Secondary | ICD-10-CM

## 2019-11-04 LAB — NOVEL CORONAVIRUS, NAA: SARS-CoV-2, NAA: NOT DETECTED

## 2019-11-11 ENCOUNTER — Other Ambulatory Visit: Payer: Self-pay

## 2019-11-11 DIAGNOSIS — Z20822 Contact with and (suspected) exposure to covid-19: Secondary | ICD-10-CM

## 2019-11-13 LAB — NOVEL CORONAVIRUS, NAA: SARS-CoV-2, NAA: NOT DETECTED

## 2019-11-24 ENCOUNTER — Other Ambulatory Visit: Payer: Self-pay

## 2019-11-24 DIAGNOSIS — Z20822 Contact with and (suspected) exposure to covid-19: Secondary | ICD-10-CM

## 2019-11-26 LAB — NOVEL CORONAVIRUS, NAA: SARS-CoV-2, NAA: NOT DETECTED

## 2019-12-08 ENCOUNTER — Ambulatory Visit: Payer: Medicare Other | Attending: Internal Medicine

## 2019-12-08 DIAGNOSIS — Z20822 Contact with and (suspected) exposure to covid-19: Secondary | ICD-10-CM

## 2019-12-10 LAB — NOVEL CORONAVIRUS, NAA: SARS-CoV-2, NAA: NOT DETECTED

## 2020-01-05 ENCOUNTER — Other Ambulatory Visit: Payer: Self-pay | Admitting: Diagnostic Neuroimaging

## 2020-01-16 ENCOUNTER — Ambulatory Visit: Payer: Medicare Other

## 2020-01-21 ENCOUNTER — Ambulatory Visit: Payer: Medicare Other

## 2020-01-25 ENCOUNTER — Telehealth (INDEPENDENT_AMBULATORY_CARE_PROVIDER_SITE_OTHER): Payer: Medicare Other | Admitting: Neurology

## 2020-01-25 ENCOUNTER — Other Ambulatory Visit: Payer: Self-pay | Admitting: Diagnostic Neuroimaging

## 2020-01-25 ENCOUNTER — Encounter: Payer: Self-pay | Admitting: Neurology

## 2020-01-25 DIAGNOSIS — G250 Essential tremor: Secondary | ICD-10-CM

## 2020-01-25 MED ORDER — PROPRANOLOL HCL 40 MG PO TABS: 40.0000 mg | ORAL_TABLET | Freq: Two times a day (BID) | ORAL | 1 refills | Status: DC

## 2020-01-25 NOTE — Progress Notes (Signed)
Virtual Visit via Video Note  I connected with Susan Norton on 01/25/20 at  2:15 PM EST by a video enabled telemedicine application and verified that I am speaking with the correct person using two identifiers.  Location: Patient: at her home Provider: in the office   I discussed the limitations of evaluation and management by telemedicine and the availability of in person appointments. The patient expressed understanding and agreed to proceed.  History of Present Illness: 01/25/2020 SS: Susan Norton is a 69 year old female with history of essential tremor.  MRI of the brain in July 2020 was unremarkable.  She remains on primidone 150 mg twice daily, propanolol 40 mg twice a day.  She is tolerating medications without side affect.  At her last visit, the primidone was increased.  She has seen benefit with the increase.  She feels her tremor remains stable.  She has more significant tremor in the left upper extremity, compared to the right. Her heart rate is staying between 60 and 70.  She has good and bad days with her tremor.  She denies any new problems or concerns. She says she is not interested at this time in DBS.  She presents today for evaluation via virtual visit, she needs a medication refill.   05/26/2019 VP: - since last visit, tremor progressing; meds not working as well - SBP at home 100-110; no dizziness - tolerating primidone 100mg  .biod and propranolol 40mg  twice a day   UPDATE (12/15/18, VRP): Since last visit, tremor has progressed in BUE (postural and action). Tolerating primidone 100mg  twice a day. No other alleviating or aggravating factors.    UPDATE 04/09/17: Since last visit, doing about the same. On primidone 50mg  BID. No side effects. Tremor mainly affects pt when she is measuring with cooking or applying makeup.   PRIOR HPI (12/26/16): 69 year old female here for evaluation of tremor. Patient has had postural and action tremor since age 40 years old. Patient's father had  some tremor and several family members on her father's side had tremor. Patient was diagnosed with possible essential tremor. Over the last few years symptoms have slightly worsened. She denies any voice tremor or leg tremor. Tremor mainly affects her hands. Symptoms are worse when she is tired or nervous. This affects her when she is reaching for things, holding utensils or handwriting. Patient tried propranolol 40 mg daily for 1-2 months without relief. Patient has normal blood pressure but tends to run slightly on the low side.  Observations/Objective: Via virtual visit, is alert and oriented, facial symmetry noted, mild postural tremor in left upper extremity, left greater than right, no head tremor noted, gait appears intact and steady  Assessment and Plan: 1.  Essential tremor -MRI of the brain in July 2020 was unremarkable -Is benefiting from dual treatment with primidone and propanolol, tolerating without side effect -Continue primidone 150 mg twice a day -Continue propanolol 40 mg twice a day (refill sent) -Follow-up in 6 months or sooner if needed  Follow Up Instructions: 6 months 07/28/2020 11:30 Amy Lomax   I discussed the assessment and treatment plan with the patient. The patient was provided an opportunity to ask questions and all were answered. The patient agreed with the plan and demonstrated an understanding of the instructions.   The patient was advised to call back or seek an in-person evaluation if the symptoms worsen or if the condition fails to improve as anticipated.  I provided 15 minutes of non-face-to-face time during this encounter.  Evangeline Dakin, DNP  [facility] [address] [place], [place] [address] [phone]

## 2020-01-27 ENCOUNTER — Ambulatory Visit: Payer: Medicare Other

## 2020-02-12 NOTE — Progress Notes (Signed)
I reviewed note and agree with plan.   Penni Bombard, MD [id number], 0000000 PM Certified in Neurology, Neurophysiology and Neuroimaging  [facility] [address] [place], [place] [address] [phone]

## 2020-03-04 ENCOUNTER — Other Ambulatory Visit: Payer: Self-pay | Admitting: Diagnostic Neuroimaging

## 2020-07-25 ENCOUNTER — Telehealth: Payer: Self-pay | Admitting: Neurology

## 2020-07-25 MED ORDER — PRIMIDONE 50 MG PO TABS: 150.0000 mg | ORAL_TABLET | Freq: Two times a day (BID) | ORAL | 0 refills | Status: DC

## 2020-07-25 NOTE — Telephone Encounter (Signed)
Pt is needing a refill on her primidone (MYSOLINE) 50 MG tablet sent in to the [facility] on [address].

## 2020-07-28 ENCOUNTER — Ambulatory Visit: Payer: Medicare Other | Admitting: Family Medicine

## 2020-09-09 ENCOUNTER — Other Ambulatory Visit: Payer: Self-pay | Admitting: Neurology

## 2020-09-12 ENCOUNTER — Other Ambulatory Visit: Payer: Self-pay | Admitting: Neurology

## 2020-10-19 ENCOUNTER — Other Ambulatory Visit: Payer: Self-pay

## 2020-10-19 ENCOUNTER — Encounter: Payer: Self-pay | Admitting: Family Medicine

## 2020-10-19 ENCOUNTER — Ambulatory Visit: Payer: Medicare Other | Admitting: Family Medicine

## 2020-10-19 VITALS — BP 105/64 | HR 64 | Ht 68.0 in | Wt 209.0 lb

## 2020-10-19 DIAGNOSIS — G250 Essential tremor: Secondary | ICD-10-CM | POA: Diagnosis not present

## 2020-10-19 NOTE — Progress Notes (Signed)
Chief Complaint  Patient presents with  . Follow-up    rm 16  . Tremors    pt says her tremors are worse.     HISTORY OF PRESENT ILLNESS: Today 10/19/20  Susan Norton is a 69 y.o. female here today for follow up for essential tremor. She continues primidone 150mg  BID and propranolol 40mg  BID. She feels that overall, she is stable.tremor may be worsening gradually. She has a hard time measuring when cooking or putting toothpaste on her toothbrush. She is able to compensate for deficit. She is interested in considering Neuro[facility].    HISTORY (copied from Elvina Sidle note on 01/25/2020)  01/25/2020 SS: Susan Norton is a 69 year old female with history of essential tremor.  MRI of the brain in July 2020 was unremarkable.  She remains on primidone 150 mg twice daily, propanolol 40 mg twice a day.  She is tolerating medications without side affect.  At her last visit, the primidone was increased.  She has seen benefit with the increase.  She feels her tremor remains stable.  She has more significant tremor in the left upper extremity, compared to the right. Her heart rate is staying between 60 and 70.  She has good and bad days with her tremor.  She denies any new problems or concerns. She says she is not interested at this time in DBS.  She presents today for evaluation via virtual visit, she needs a medication refill.   05/26/2019 VP: - since last visit, tremor progressing; meds not working as well - SBP at home 100-110; no dizziness - tolerating primidone 100mg  .biod and propranolol 40mg twice a day  UPDATE (12/15/18, VRP): Since last visit,tremor has progressed in BUE (postural and action). Tolerating primidone 100mg twice a day.Nootheralleviating or aggravating factors.  UPDATE 04/09/17: Since last visit, doing about the same. On primidone 50mg  BID. No side effects. Tremor mainly affects pt when she is measuring with cooking or applying makeup.   PRIOR HPI (12/26/16): 69 year old  female here for evaluation of tremor. Patient has had postural and action tremor since age 79 years old. Patient's father had some tremor and several family members on her father's side had tremor. Patient was diagnosed with possible essential tremor. Over the last few years symptoms have slightly worsened. She denies any voice tremor or leg tremor. Tremor mainly affects her hands. Symptoms are worse when she is tired or nervous. This affects her when she is reaching for things, holding utensils or handwriting. Patient tried propranolol 40 mg daily for 1-2 months without relief. Patient has normal blood pressure but tends to run slightly on the low side.    REVIEW OF SYSTEMS: Out of a complete 14 system review of symptoms, the patient complains only of the following symptoms, tremor and all other reviewed systems are negative.   ALLERGIES: No Known Allergies   HOME MEDICATIONS: Outpatient Medications Prior to Visit  Medication Sig Dispense Refill  . Boswellia-Glucosamine-Vit D (OSTEO BI-FLEX ONE PER DAY PO) Take by mouth.    . Cholecalciferol (VITAMIN D3) 50 MCG (2000 UT) TABS Take by mouth.    Marland Kitchen KRILL OIL PO Take by mouth.    . Multiple Vitamins-Minerals (MULTIVITAMIN WITH MINERALS) tablet Take 1 tablet by mouth daily.    . primidone (MYSOLINE) 50 MG tablet Take 3 tablets (150 mg total) by mouth 2 (two) times daily. 540 tablet 0  . propranolol (INDERAL) 40 MG tablet TAKE ONE TABLET BY MOUTH TWICE A DAY 180 tablet 0  .  NONFORMULARY OR COMPOUNDED ITEM [facility]  Peripheral Neuropathy Cream- Bupivacaine 1%, Doxepin 3%, Gabapentin 6%, Pentoxifylline 3%, Topiramate 1% Apply 1-2 grams to affected area 3-4 times daily Qty. 120 gm 3 refills (Patient not taking: Reported on 12/15/2018) 1 each 2  . ranitidine (ZANTAC) 150 MG tablet Take 150 mg by mouth as needed for heartburn.     No facility-administered medications prior to visit.     PAST MEDICAL HISTORY: Past Medical History:    Diagnosis Date  . Sleep apnea    CPAP  . Torn meniscus    both knees  . Torn tendon 2019   foot  . Tremor      PAST SURGICAL HISTORY: Past Surgical History:  Procedure Laterality Date  . ABDOMINAL SURGERY     small growth removed  . KIDNEY STONE SURGERY       FAMILY HISTORY: Family History  Problem Relation Age of Onset  . Atrial fibrillation Mother   . Cancer Mother 72       breast  . Stroke Mother        x 8  . Hypertension Mother   . Cancer Father        prostate     SOCIAL HISTORY: Social History   Socioeconomic History  . Marital status: Single    Spouse name: Not on file  . Number of children: 0  . Years of education: 107  . Highest education level: Not on file  Occupational History    Comment: Architect  Tobacco Use  . Smoking status: Never Smoker  . Smokeless tobacco: Never Used  Substance and Sexual Activity  . Alcohol use: Yes    Comment: 12/26/16 1-2 glasses wine/night  . Drug use: No  . Sexual activity: Not on file  Other Topics Concern  . Not on file  Social History Narrative   Lives alone   No caffeine   Social Determinants of Health   Financial Resource Strain:   . Difficulty of Paying Living Expenses: Not on file  Food Insecurity:   . Worried About Charity fundraiser in the Last Year: Not on file  . Ran Out of Food in the Last Year: Not on file  Transportation Needs:   . Lack of Transportation (Medical): Not on file  . Lack of Transportation (Non-Medical): Not on file  Physical Activity:   . Days of Exercise per Week: Not on file  . Minutes of Exercise per Session: Not on file  Stress:   . Feeling of Stress : Not on file  Social Connections:   . Frequency of Communication with Friends and Family: Not on file  . Frequency of Social Gatherings with Friends and Family: Not on file  . Attends Religious Services: Not on file  . Active Member of Clubs or Organizations: Not on file  . Attends Archivist  Meetings: Not on file  . Marital Status: Not on file  Intimate Partner Violence:   . Fear of Current or Ex-Partner: Not on file  . Emotionally Abused: Not on file  . Physically Abused: Not on file  . Sexually Abused: Not on file      PHYSICAL EXAM  Vitals:   10/19/20 1332  BP: 105/64  Pulse: 64  Weight: 209 lb (94.8 kg)  Height: 5\' 8"  (1.727 m)   Body mass index is 31.78 kg/m.   Generalized: Well developed, in no acute distress   Neurological examination  Mentation: Alert oriented to time, place,  history taking. Follows all commands speech and language fluent Cranial nerve II-XII: Pupils were equal round reactive to light. Extraocular movements were full, visual field were full  Motor: The motor testing reveals 5 over 5 strength of all 4 extremities. Good symmetric motor tone is noted throughout. Action tremor noted of bilateral hands.  Sensory: Sensory testing is intact to soft touch on all 4 extremities. No evidence of extinction is noted.  Coordination: Cerebellar testing reveals good finger-nose-finger and heel-to-shin bilaterally.  Gait and station: Gait is normal.     DIAGNOSTIC DATA (LABS, IMAGING, TESTING) - I reviewed patient records, labs, notes, testing and imaging myself where available.  No results found for: WBC, HGB, HCT, MCV, PLT No results found for: NA, K, CL, CO2, GLUCOSE, BUN, CREATININE, CALCIUM, PROT, ALBUMIN, AST, ALT, ALKPHOS, BILITOT, GFRNONAA, GFRAA No results found for: CHOL, HDL, LDLCALC, LDLDIRECT, TRIG, CHOLHDL No results found for: HGBA1C No results found for: VITAMINB12 No results found for: TSH    ASSESSMENT AND PLAN  69 y.o. year old female  has a past medical history of Sleep apnea, Torn meniscus, Torn tendon (2019), and Tremor. here with   Essential tremor  Susan Norton presents today for follow-up of essential tremor.  She is doing well on propranolol 40 mg twice daily and primidone 150 mg twice daily.  We will continue current  treatment plan.  She is also interested in considering MRI guided ultrasonography for treatment of essential tremor.  We will send referral to [facility] for consult and discussion of treatment options.  We will update primidone levels today.  CPE labs are performed with primary care and reportedly normal.  Healthy lifestyle habits encouraged.  She will follow-up with me in 1 year.  She verbalizes understanding and agreement with this plan.   I spent 20 minutes of face-to-face and non-face-to-face time with patient.  This included previsit chart review, lab review, study review, order entry, electronic health record documentation, patient education.    Debbora Presto, MSN, FNP-C 10/19/2020, 1:43 PM  [facility] [address] [place], [place] [address] [phone]

## 2020-10-19 NOTE — Progress Notes (Signed)
I reviewed note and agree with plan.   Penni Bombard, MD 45/07/4834, 0:75 PM Certified in Neurology, Neurophysiology and Neuroimaging  [facility] [address], [place], [place] [address] [phone]

## 2020-10-19 NOTE — Patient Instructions (Signed)
Below is our plan:  We will continue primidone 150mg  twice daily and propranolol 40mg  twice daily. We will send referral to movement specialist at [facility] who can help answer your questions regarding noon invasive treatment options. I will also update labs today. Continue close follow up with PCP for annual CPE labs.   Please make sure you are staying well hydrated. I recommend 50-60 ounces daily. Well balanced diet and regular exercise encouraged.   Please continue follow up with care team as directed.   Follow up with me in 1 year  You may receive a survey regarding today's visit. I encourage you to leave honest feed back as I do use this information to improve patient care. Thank you for seeing me today!       Essential Tremor A tremor is trembling or shaking that a person cannot control. Most tremors affect the hands or arms. Tremors can also affect the head, vocal cords, legs, and other parts of the body. Essential tremor is a tremor without a known cause. Usually, it occurs while a person is trying to perform an action. It tends to get worse gradually as a person ages. What are the causes? The cause of this condition is not known. What increases the risk? You are more likely to develop this condition if:  You have a family member with essential tremor.  You are age 48 or older.  You take certain medicines. What are the signs or symptoms? The main sign of a tremor is a rhythmic shaking of certain parts of your body that is uncontrolled and unintentional. You may:  Have difficulty eating with a spoon or fork.  Have difficulty writing.  Nod your head up and down or side to side.  Have a quivering voice. The shaking may:  Get worse over time.  Come and go.  Be more noticeable on one side of your body.  Get worse due to stress, fatigue, caffeine, and extreme heat or cold. How is this diagnosed? This condition may be diagnosed based on:  Your symptoms and  medical history.  A physical exam. There is no single test to diagnose an essential tremor. However, your health care provider may order tests to rule out other causes of your condition. These may include:  Blood and urine tests.  Imaging studies of your brain, such as CT scan and MRI.  A test that measures involuntary muscle movement (electromyogram). How is this treated? Treatment for essential tremor depends on the severity of the condition.  Some tremors may go away without treatment.  Mild tremors may not need treatment if they do not affect your day-to-day life.  Severe tremors may need to be treated using one or more of the following options: ? Medicines. ? Lifestyle changes. ? Occupational or physical therapy. Follow these instructions at home: Lifestyle   Do not use any products that contain nicotine or tobacco, such as cigarettes and e-cigarettes. If you need help quitting, ask your health care provider.  Limit your caffeine intake as told by your health care provider.  Try to get 8 hours of sleep each night.  Find ways to manage your stress that fits your lifestyle and personality. Consider trying meditation or yoga.  Try to anticipate stressful situations and allow extra time to manage them.  If you are struggling emotionally with the effects of your tremor, consider working with a mental health provider. General instructions  Take over-the-counter and prescription medicines only as told by your health  care provider.  Avoid extreme heat and extreme cold.  Keep all follow-up visits as told by your health care provider. This is important. Visits may include physical therapy visits. Contact a health care provider if:  You experience any changes in the location or intensity of your tremors.  You start having a tremor after starting a new medicine.  You have tremor with other symptoms, such as: ? Numbness. ? Tingling. ? Pain. ? Weakness.  Your tremor gets  worse.  Your tremor interferes with your daily life.  You feel down, blue, or sad for at least 2 weeks in a row.  Worrying about your tremor and what other people think about you interferes with your everyday life functions, including relationships, work, or school. Summary  Essential tremor is a tremor without a known cause. Usually, it occurs when you are trying to perform an action.  The cause of this condition is not known.  The main sign of a tremor is a rhythmic shaking of certain parts of your body that is uncontrolled and unintentional.  Treatment for essential tremor depends on the severity of the condition. This information is not intended to replace advice given to you by your health care provider. Make sure you discuss any questions you have with your health care provider. Document Revised: 12/13/2017 Document Reviewed: 12/13/2017 Elsevier Patient Education  2020 [employer].

## 2020-10-21 LAB — PRIMIDONE, SERUM
Phenobarbital, Serum: 4 ug/mL — ABNORMAL LOW (ref 15–40)
Primidone Lvl: 5.1 ug/mL (ref 5.0–12.0)

## 2020-10-24 ENCOUNTER — Telehealth: Payer: Self-pay

## 2020-10-24 NOTE — Telephone Encounter (Signed)
Attempted to call the patient without success.  LM on the VM for the patient to call back re: recent results.  **If the patient calls back please relay the message below from Debbora Presto NP

## 2020-10-24 NOTE — Telephone Encounter (Signed)
-----   Message from Debbora Presto, NP sent at 10/24/2020  1:30 PM EST ----- Labs are normal. TY

## 2020-11-15 ENCOUNTER — Other Ambulatory Visit: Payer: Self-pay | Admitting: Neurology

## 2021-01-06 ENCOUNTER — Other Ambulatory Visit: Payer: Self-pay | Admitting: Neurology

## 2021-01-18 DIAGNOSIS — L821 Other seborrheic keratosis: Secondary | ICD-10-CM | POA: Diagnosis not present

## 2021-01-18 DIAGNOSIS — L905 Scar conditions and fibrosis of skin: Secondary | ICD-10-CM | POA: Diagnosis not present

## 2021-01-18 DIAGNOSIS — D225 Melanocytic nevi of trunk: Secondary | ICD-10-CM | POA: Diagnosis not present

## 2021-01-18 DIAGNOSIS — D485 Neoplasm of uncertain behavior of skin: Secondary | ICD-10-CM | POA: Diagnosis not present

## 2021-01-18 DIAGNOSIS — Z85828 Personal history of other malignant neoplasm of skin: Secondary | ICD-10-CM | POA: Diagnosis not present

## 2021-01-18 DIAGNOSIS — L570 Actinic keratosis: Secondary | ICD-10-CM | POA: Diagnosis not present

## 2021-01-18 DIAGNOSIS — L820 Inflamed seborrheic keratosis: Secondary | ICD-10-CM | POA: Diagnosis not present

## 2021-02-08 DIAGNOSIS — B078 Other viral warts: Secondary | ICD-10-CM | POA: Diagnosis not present

## 2021-02-08 DIAGNOSIS — L570 Actinic keratosis: Secondary | ICD-10-CM | POA: Diagnosis not present

## 2021-02-15 DIAGNOSIS — E559 Vitamin D deficiency, unspecified: Secondary | ICD-10-CM | POA: Diagnosis not present

## 2021-02-15 DIAGNOSIS — G4733 Obstructive sleep apnea (adult) (pediatric): Secondary | ICD-10-CM | POA: Diagnosis not present

## 2021-02-15 DIAGNOSIS — M8588 Other specified disorders of bone density and structure, other site: Secondary | ICD-10-CM | POA: Diagnosis not present

## 2021-02-15 DIAGNOSIS — Z0000 Encounter for general adult medical examination without abnormal findings: Secondary | ICD-10-CM | POA: Diagnosis not present

## 2021-02-15 DIAGNOSIS — E7800 Pure hypercholesterolemia, unspecified: Secondary | ICD-10-CM | POA: Diagnosis not present

## 2021-02-15 DIAGNOSIS — G250 Essential tremor: Secondary | ICD-10-CM | POA: Diagnosis not present

## 2021-02-15 DIAGNOSIS — R252 Cramp and spasm: Secondary | ICD-10-CM | POA: Diagnosis not present

## 2021-03-03 DIAGNOSIS — D7589 Other specified diseases of blood and blood-forming organs: Secondary | ICD-10-CM | POA: Diagnosis not present

## 2021-03-08 DIAGNOSIS — G4733 Obstructive sleep apnea (adult) (pediatric): Secondary | ICD-10-CM | POA: Diagnosis not present

## 2021-03-23 DIAGNOSIS — H6123 Impacted cerumen, bilateral: Secondary | ICD-10-CM | POA: Diagnosis not present

## 2021-03-30 ENCOUNTER — Ambulatory Visit: Payer: Medicare Other | Attending: Internal Medicine

## 2021-03-30 ENCOUNTER — Telehealth: Payer: Self-pay | Admitting: Family Medicine

## 2021-03-30 ENCOUNTER — Other Ambulatory Visit: Payer: Self-pay

## 2021-03-30 DIAGNOSIS — Z20822 Contact with and (suspected) exposure to covid-19: Secondary | ICD-10-CM

## 2021-03-30 MED ORDER — PRIMIDONE 50 MG PO TABS: ORAL_TABLET | ORAL | 0 refills | Status: DC

## 2021-03-30 NOTE — Telephone Encounter (Signed)
Rx approved

## 2021-03-30 NOTE — Telephone Encounter (Signed)
Pt is requesting a refill for primidone (MYSOLINE) 50 MG tablet.  Ph[facility]: [facility] [phone]

## 2021-03-31 LAB — NOVEL CORONAVIRUS, NAA: SARS-CoV-2, NAA: NOT DETECTED

## 2021-03-31 LAB — SARS-COV-2, NAA 2 DAY TAT

## 2021-04-05 DIAGNOSIS — Z20822 Contact with and (suspected) exposure to covid-19: Secondary | ICD-10-CM | POA: Diagnosis not present

## 2021-04-06 DIAGNOSIS — Z20822 Contact with and (suspected) exposure to covid-19: Secondary | ICD-10-CM | POA: Diagnosis not present

## 2021-05-08 DIAGNOSIS — Z7689 Persons encountering health services in other specified circumstances: Secondary | ICD-10-CM | POA: Diagnosis not present

## 2021-05-08 DIAGNOSIS — M8580 Other specified disorders of bone density and structure, unspecified site: Secondary | ICD-10-CM | POA: Diagnosis not present

## 2021-06-08 DIAGNOSIS — G4733 Obstructive sleep apnea (adult) (pediatric): Secondary | ICD-10-CM | POA: Diagnosis not present

## 2021-08-07 ENCOUNTER — Other Ambulatory Visit: Payer: Self-pay

## 2021-08-07 MED ORDER — PRIMIDONE 50 MG PO TABS: ORAL_TABLET | ORAL | 0 refills | Status: DC

## 2021-08-09 DIAGNOSIS — L905 Scar conditions and fibrosis of skin: Secondary | ICD-10-CM | POA: Diagnosis not present

## 2021-08-09 DIAGNOSIS — L570 Actinic keratosis: Secondary | ICD-10-CM | POA: Diagnosis not present

## 2021-08-09 DIAGNOSIS — Z85828 Personal history of other malignant neoplasm of skin: Secondary | ICD-10-CM | POA: Diagnosis not present

## 2021-08-09 DIAGNOSIS — L820 Inflamed seborrheic keratosis: Secondary | ICD-10-CM | POA: Diagnosis not present

## 2021-09-07 DIAGNOSIS — G4733 Obstructive sleep apnea (adult) (pediatric): Secondary | ICD-10-CM | POA: Diagnosis not present

## 2021-10-17 ENCOUNTER — Telehealth: Payer: Self-pay | Admitting: Family Medicine

## 2021-10-17 MED ORDER — PROPRANOLOL HCL 40 MG PO TABS: 40.0000 mg | ORAL_TABLET | Freq: Two times a day (BID) | ORAL | 1 refills | Status: DC

## 2021-10-17 NOTE — Telephone Encounter (Signed)
Pt is asking for a refill on her  propranolol (INDERAL) 40 MG tablet to [facility] [phone]

## 2021-10-17 NOTE — Telephone Encounter (Signed)
E-scribed refill to pharmacy. She has yearly scheduled for 12/04/21 w/ Dr. Leta Baptist.

## 2021-10-19 ENCOUNTER — Ambulatory Visit: Payer: Medicare Other | Admitting: Family Medicine

## 2021-11-01 DIAGNOSIS — D1721 Benign lipomatous neoplasm of skin and subcutaneous tissue of right arm: Secondary | ICD-10-CM | POA: Diagnosis not present

## 2021-11-01 DIAGNOSIS — L821 Other seborrheic keratosis: Secondary | ICD-10-CM | POA: Diagnosis not present

## 2021-11-02 DIAGNOSIS — G4733 Obstructive sleep apnea (adult) (pediatric): Secondary | ICD-10-CM | POA: Diagnosis not present

## 2021-11-30 DIAGNOSIS — Z1231 Encounter for screening mammogram for malignant neoplasm of breast: Secondary | ICD-10-CM | POA: Diagnosis not present

## 2021-12-04 ENCOUNTER — Ambulatory Visit: Payer: Medicare Other | Admitting: Diagnostic Neuroimaging

## 2021-12-04 ENCOUNTER — Other Ambulatory Visit: Payer: Self-pay

## 2021-12-04 ENCOUNTER — Encounter: Payer: Self-pay | Admitting: Diagnostic Neuroimaging

## 2021-12-04 VITALS — BP 103/68 | HR 64 | Ht 68.5 in | Wt 208.0 lb

## 2021-12-04 DIAGNOSIS — R200 Anesthesia of skin: Secondary | ICD-10-CM | POA: Diagnosis not present

## 2021-12-04 DIAGNOSIS — G250 Essential tremor: Secondary | ICD-10-CM

## 2021-12-04 MED ORDER — PRIMIDONE 50 MG PO TABS: 150.0000 mg | ORAL_TABLET | Freq: Two times a day (BID) | ORAL | 4 refills | Status: DC

## 2021-12-04 MED ORDER — PROPRANOLOL HCL 40 MG PO TABS: 40.0000 mg | ORAL_TABLET | Freq: Two times a day (BID) | ORAL | 4 refills | Status: DC

## 2021-12-04 NOTE — Progress Notes (Signed)
[facility]  PATIENT: Susan Norton DOB: 11/01/1951  REFERRING CLINICIAN: Leighton Ruff, MD HISTORY FROM: patient  REASON FOR VISIT: follow up   [place]:  Chief Complaint  Patient presents with   Tremors    Rm 7, one year FU   "tremor are stable; pins/needles in feet, burn but get better when I walk"     HISTORY OF PRESENT ILLNESS:   UPDATE (12/04/21, VRP): Since last visit, doing about the same. No change in tremor with meds. Had 2nd opinion with Dr. Linus Mako at [facility]. ET confirmed. Offered DBS vs research study. Interested in neuromodulation device. Also with some numbness in toes t hat is new.   UPDATE (12/15/18, VRP): Since last visit, tremor has progressed in BUE (postural and action). Tolerating primidone 100mg  twice a day. No other alleviating or aggravating factors.     UPDATE 04/09/17: Since last visit, doing about the same. On primidone 50mg  BID. No side effects. Tremor mainly affects pt when she is measuring with cooking or applying makeup.    PRIOR HPI (12/26/16): 70 year old female here for evaluation of tremor. Patient has had postural and action tremor since age 62 years old. Patient's father had some tremor and several family members on her father's side had tremor. Patient was diagnosed with possible essential tremor. Over the last few years symptoms have slightly worsened. She denies any voice tremor or leg tremor. Tremor mainly affects her hands. Symptoms are worse when she is tired or nervous. This affects her when she is reaching for things, holding utensils or handwriting. Patient tried propranolol 40 mg daily for 1-2 months without relief. Patient has normal blood pressure but tends to run slightly on the low side.  REVIEW OF SYSTEMS: Full 14 system review of systems performed and negative with exception of: as per HPI.  ALLERGIES: No Known Allergies  HOME MEDICATIONS: Outpatient Medications Prior to Visit  Medication  Sig Dispense Refill   Boswellia-Glucosamine-Vit D (OSTEO BI-FLEX ONE PER DAY PO) Take by mouth.     Cholecalciferol (VITAMIN D3) 50 MCG (2000 UT) TABS Take by mouth.     Multiple Vitamins-Minerals (MULTIVITAMIN WITH MINERALS) tablet Take 1 tablet by mouth daily.     primidone (MYSOLINE) 50 MG tablet Take three (3) tablets by mouth twice (2) daily 540 tablet 0   propranolol (INDERAL) 40 MG tablet Take 1 tablet (40 mg total) by mouth 2 (two) times daily. Must keep follow up 12/04/21 for future refills 60 tablet 1   KRILL OIL PO Take by mouth.     No facility-administered medications prior to visit.      PHYSICAL EXAM  GENERAL EXAM/CONSTITUTIONAL: Vitals:  Vitals:   12/04/21 1638  BP: 103/68  Pulse: 64  Weight: 208 lb (94.3 kg)  Height: 5' 8.5" (1.74 m)   Body mass index is 31.17 kg/m. Wt Readings from Last 3 Encounters:  12/04/21 208 lb (94.3 kg)  10/19/20 209 lb (94.8 kg)  12/15/18 210 lb 6.4 oz (95.4 kg)   Patient is in no distress; well developed, nourished and groomed; neck is supple  CARDIOVASCULAR: Examination of carotid arteries is normal; no carotid bruits Regular rate and rhythm, no murmurs Examination of peripheral vascular system by observation and palpation is normal  EYES: Ophthalmoscopic exam of optic discs and posterior segments is normal; no papilledema or hemorrhages No results found.  MUSCULOSKELETAL: Gait, strength, tone, movements noted in Neurologic exam below  NEUROLOGIC: MENTAL STATUS:  No flowsheet data found. awake,  alert, oriented to person, place and time recent and remote memory intact normal attention and concentration language fluent, comprehension intact, naming intact fund of knowledge appropriate  CRANIAL NERVE:  2nd - no papilledema on fundoscopic exam 2nd, 3rd, 4th, 6th - pupils equal and reactive to light, visual fields full to confrontation, extraocular muscles intact, no nystagmus 5th - facial sensation symmetric 7th -  facial strength symmetric 8th - hearing intact 9th - palate elevates symmetrically, uvula midline 11th - shoulder shrug symmetric 12th - tongue protrusion midline  MOTOR:  POSTURAL AND ACTION TREMOR IN BUE normal bulk and tone, full strength in the BUE, BLE  SENSORY:  normal and symmetric to light touch, temperature, vibration  COORDINATION:  finger-nose-finger, fine finger movements normal  REFLEXES:  deep tendon reflexes present and symmetric  GAIT/STATION:  narrow based gait     DIAGNOSTIC DATA (LABS, IMAGING, TESTING) - I reviewed patient records, labs, notes, testing and imaging myself where available.  No results found for: WBC, HGB, HCT, MCV, PLT No results found for: NA, K, CL, CO2, GLUCOSE, BUN, CREATININE, CALCIUM, PROT, ALBUMIN, AST, ALT, ALKPHOS, BILITOT, GFRNONAA, GFRAA No results found for: CHOL, HDL, LDLCALC, LDLDIRECT, TRIG, CHOLHDL No results found for: HGBA1C No results found for: VITAMINB12 No results found for: TSH     ASSESSMENT AND PLAN  70 y.o. year old female here with:   Dx:  1. Essential tremor   2. Numbness      PLAN:  ESSENTIAL TREMOR - primidone 150mg  twice a day - propranolol 40mg  twice a day - consider gabapentin or topiramate - consider neuromodulation device (cala trio); right worse than left  TINGLING IN FEET / NEUROPATHY?  - EMG/NCS - then neuropathy labs  Meds ordered this encounter  Medications   primidone (MYSOLINE) 50 MG tablet    Sig: Take 3 tablets (150 mg total) by mouth in the morning and at bedtime. Take three (3) tablets by mouth twice (2) daily    Dispense:  540 tablet    Refill:  4   propranolol (INDERAL) 40 MG tablet    Sig: Take 1 tablet (40 mg total) by mouth 2 (two) times daily.    Dispense:  180 tablet    Refill:  4   Orders Placed This Encounter  Procedures   NCV with EMG(electromyography)   Return in about 9 months (around 09/04/2022) for MyChart visit (15 min).    Penni Bombard, MD 22/01/5426, 0:62 PM Certified in Neurology, Neurophysiology and Neuroimaging  [facility] [address] [place], [place] [address] [phone]

## 2021-12-06 DIAGNOSIS — G4733 Obstructive sleep apnea (adult) (pediatric): Secondary | ICD-10-CM | POA: Diagnosis not present

## 2021-12-20 DIAGNOSIS — R928 Other abnormal and inconclusive findings on diagnostic imaging of breast: Secondary | ICD-10-CM | POA: Diagnosis not present

## 2021-12-20 DIAGNOSIS — N6489 Other specified disorders of breast: Secondary | ICD-10-CM | POA: Diagnosis not present

## 2021-12-20 DIAGNOSIS — R922 Inconclusive mammogram: Secondary | ICD-10-CM | POA: Diagnosis not present

## 2022-01-11 ENCOUNTER — Ambulatory Visit (INDEPENDENT_AMBULATORY_CARE_PROVIDER_SITE_OTHER): Payer: Medicare Other | Admitting: Diagnostic Neuroimaging

## 2022-01-11 ENCOUNTER — Other Ambulatory Visit: Payer: Self-pay

## 2022-01-11 ENCOUNTER — Encounter: Payer: Medicare Other | Admitting: Diagnostic Neuroimaging

## 2022-01-11 DIAGNOSIS — Z0289 Encounter for other administrative examinations: Secondary | ICD-10-CM

## 2022-01-11 DIAGNOSIS — R200 Anesthesia of skin: Secondary | ICD-10-CM

## 2022-01-11 DIAGNOSIS — G629 Polyneuropathy, unspecified: Secondary | ICD-10-CM | POA: Diagnosis not present

## 2022-01-11 NOTE — Procedures (Signed)
[facility]  NCS (NERVE CONDUCTION STUDY) WITH EMG (ELECTROMYOGRAPHY) REPORT   STUDY DATE: 01/11/22 PATIENT NAME: Susan Norton DOB: 02/23/1951 MRN: [id number]  ORDERING CLINICIAN: 01/11/22  TECHNOLOGIST: Sherre Scarlet ELECTROMYOGRAPHER: Earlean Polka. Sabin Gibeault, MD  CLINICAL INFORMATION: 71 year old female with lower extremity numbness.  High arches and hammertoes.  Mother had similar hammertoes.  FINDINGS: NERVE CONDUCTION STUDY:  Bilateral peroneal motor responses could not be obtained.  Bilateral tibial motor responses of decreased amplitudes and slow conduction bodies.  Left tibial F-wave latency is normal.  Right tibial F-wave latency is slightly prolonged.  Bilateral superficial peroneal sensory responses cannot be obtained.  Bilateral sural sensory responses have decreased amplitudes.   NEEDLE ELECTROMYOGRAPHY:  Needle examination of right lower extremity is unremarkable.   IMPRESSION:   Abnormal study demonstrating: - Moderate to severe, axonal sensorimotor polyneuropathy.   INTERPRETING PHYSICIAN:  Penni Bombard, MD Certified in Neurology, Neurophysiology and Neuroimaging  [facility] [address] [place], [place] [address] [phone]   [facility]    Nerve / Sites Muscle Latency Ref. Amplitude Ref. Rel Amp Segments Distance Velocity Ref. Area    ms ms mV mV %  cm m/s m/s mVms  L Peroneal - EDB     Ankle EDB NR ?6.5 NR ?2.0 NR Ankle - EDB 9   NR     Fib head EDB NR  NR  NR Fib head - Ankle 29 NR ?44 NR     Pop fossa EDB NR  NR  NR Pop fossa - Fib head 10 NR ?44 NR         Pop fossa - Ankle      R Peroneal - EDB     Ankle EDB NR ?6.5 NR ?2.0 NR Ankle - EDB 9   NR     Fib head EDB NR  NR  NR Fib head - Ankle 30 NR ?44 NR     Pop fossa EDB NR  NR  NR Pop fossa - Fib head 10 NR ?44 NR         Pop fossa - Ankle      L Tibial - AH     Ankle AH 4.5 ?5.8 2.3 ?4.0 100 Ankle - AH 9   11.0     Pop fossa AH 16.2  1.8   78.9 Pop fossa - Ankle 42 36 ?41 5.6  R Tibial - AH     Ankle AH 4.8 ?5.8 3.4 ?4.0 100 Ankle - AH 9   12.7     Pop fossa AH 16.6  2.6  76.9 Pop fossa - Ankle 42 35 ?41 12.7             SNC    Nerve / Sites Rec. Site Peak Lat Ref.  Amp Ref. Segments Distance    ms ms V V  cm  L Sural - Ankle (Calf)     Calf Ankle 3.4 ?4.4 4 ?6 Calf - Ankle 14  R Sural - Ankle (Calf)     Calf Ankle 3.4 ?4.4 3 ?6 Calf - Ankle 14  L Superficial peroneal - Ankle     Lat leg Ankle NR ?4.4 NR ?6 Lat leg - Ankle 14  R Superficial peroneal - Ankle     Lat leg Ankle NR ?4.4 NR ?6 Lat leg - Ankle 14             F  Wave    Nerve F Lat  Ref.   ms ms  L Tibial - AH 43.1 ?56.0  R Tibial - AH 60.6 ?56.0         EMG Summary Table    Spontaneous MUAP Recruitment  Muscle IA Fib PSW Fasc Other Amp Dur. Poly Pattern  R. Vastus medialis Normal None None None _______ Normal Normal Normal Normal  R. Tibialis anterior Normal None None None _______ Normal Normal Normal Normal  R. Gastrocnemius (Medial head) Normal None None None _______ Normal Normal Normal Normal

## 2022-01-11 NOTE — Progress Notes (Signed)
Orders Placed This Encounter  Procedures   CBC with diff   CMP   Vitamin B12   A1c   TSH   SPEP with IFE   ANA w/Reflex   SSA, SSB   Vitamin B6   Vitamin B1   ANCA Profile   Penni Bombard, MD 8/88/3584, 4:65 PM Certified in Neurology, Neurophysiology and Neuroimaging  [facility] [address] [place], [place] [address] [phone]

## 2022-01-18 ENCOUNTER — Other Ambulatory Visit: Payer: Self-pay | Admitting: *Deleted

## 2022-01-18 ENCOUNTER — Encounter: Payer: Self-pay | Admitting: *Deleted

## 2022-01-18 DIAGNOSIS — R899 Unspecified abnormal finding in specimens from other organs, systems and tissues: Secondary | ICD-10-CM

## 2022-01-18 DIAGNOSIS — R200 Anesthesia of skin: Secondary | ICD-10-CM

## 2022-01-18 DIAGNOSIS — R94131 Abnormal electromyogram [EMG]: Secondary | ICD-10-CM

## 2022-01-18 DIAGNOSIS — G629 Polyneuropathy, unspecified: Secondary | ICD-10-CM

## 2022-01-18 LAB — COMPREHENSIVE METABOLIC PANEL
ALT: 15 IU/L (ref 0–32)
AST: 19 IU/L (ref 0–40)
Albumin/Globulin Ratio: 1.6 (ref 1.2–2.2)
Albumin: 4 g/dL (ref 3.8–4.8)
Alkaline Phosphatase: 65 IU/L (ref 44–121)
BUN/Creatinine Ratio: 22 (ref 12–28)
BUN: 15 mg/dL (ref 8–27)
Bilirubin Total: 0.2 mg/dL (ref 0.0–1.2)
CO2: 24 mmol/L (ref 20–29)
Calcium: 9 mg/dL (ref 8.7–10.3)
Chloride: 106 mmol/L (ref 96–106)
Creatinine, Ser: 0.68 mg/dL (ref 0.57–1.00)
Globulin, Total: 2.5 g/dL (ref 1.5–4.5)
Glucose: 95 mg/dL (ref 70–99)
Potassium: 4.4 mmol/L (ref 3.5–5.2)
Sodium: 144 mmol/L (ref 134–144)
Total Protein: 6.5 g/dL (ref 6.0–8.5)
eGFR: 94 mL/min/{1.73_m2} (ref >59)

## 2022-01-18 LAB — CBC WITH DIFFERENTIAL/PLATELET
Basophils Absolute: 0 10*3/uL (ref 0.0–0.2)
Basos: 1 %
EOS (ABSOLUTE): 0.1 10*3/uL (ref 0.0–0.4)
Eos: 1 %
Hematocrit: 39.8 % (ref 34.0–46.6)
Hemoglobin: 13.4 g/dL (ref 11.1–15.9)
Immature Grans (Abs): 0 10*3/uL (ref 0.0–0.1)
Immature Granulocytes: 0 %
Lymphocytes Absolute: 1.5 10*3/uL (ref 0.7–3.1)
Lymphs: 21 %
MCH: 33.6 pg — ABNORMAL HIGH (ref 26.6–33.0)
MCHC: 33.7 g/dL (ref 31.5–35.7)
MCV: 100 fL — ABNORMAL HIGH (ref 79–97)
Monocytes Absolute: 0.5 10*3/uL (ref 0.1–0.9)
Monocytes: 7 %
Neutrophils Absolute: 5 10*3/uL (ref 1.4–7.0)
Neutrophils: 70 %
Platelets: 183 10*3/uL (ref 150–450)
RBC: 3.99 x10E6/uL (ref 3.77–5.28)
RDW: 12 % (ref 11.7–15.4)
WBC: 7.1 10*3/uL (ref 3.4–10.8)

## 2022-01-18 LAB — MULTIPLE MYELOMA PANEL, SERUM
Albumin SerPl Elph-Mcnc: 3.6 g/dL (ref 2.9–4.4)
Albumin/Glob SerPl: 1.3 (ref 0.7–1.7)
Alpha 1: 0.2 g/dL (ref 0.0–0.4)
Alpha2 Glob SerPl Elph-Mcnc: 0.7 g/dL (ref 0.4–1.0)
B-Globulin SerPl Elph-Mcnc: 1 g/dL (ref 0.7–1.3)
Gamma Glob SerPl Elph-Mcnc: 1.1 g/dL (ref 0.4–1.8)
Globulin, Total: 2.9 g/dL (ref 2.2–3.9)
IgA/Immunoglobulin A, Serum: 237 mg/dL (ref 87–352)
IgG (Immunoglobin G), Serum: 909 mg/dL (ref 586–1602)
IgM (Immunoglobulin M), Srm: 94 mg/dL (ref 26–217)

## 2022-01-18 LAB — ANCA PROFILE
Anti-MPO Antibodies: <0.2 units (ref 0.0–0.9)
Anti-PR3 Antibodies: <0.2 units (ref 0.0–0.9)
Atypical pANCA: <1:20 {titer}
C-ANCA: <1:20 {titer}
P-ANCA: <1:20 {titer}

## 2022-01-18 LAB — ENA+DNA/DS+ANTICH+CENTRO+FA...
Anti JO-1: <0.2 AI (ref 0.0–0.9)
Antiribosomal P Antibodies: <0.2 AI (ref 0.0–0.9)
Centromere Ab Screen: <0.2 AI (ref 0.0–0.9)
Chromatin Ab SerPl-aCnc: <0.2 AI (ref 0.0–0.9)
ENA RNP Ab: <0.2 AI (ref 0.0–0.9)
ENA SM Ab Ser-aCnc: <0.2 AI (ref 0.0–0.9)
Homogeneous Pattern: 1:80 {titer}
Scleroderma (Scl-70) (ENA) Antibody, IgG: <0.2 AI (ref 0.0–0.9)
Smith/RNP Antibodies: <0.2 AI (ref 0.0–0.9)
dsDNA Ab: <1 IU/mL (ref 0–9)

## 2022-01-18 LAB — ANA W/REFLEX: ANA Titer 1: POSITIVE — AB

## 2022-01-18 LAB — SJOGREN'S SYNDROME ANTIBODS(SSA + SSB)
ENA SSA (RO) Ab: <0.2 AI (ref 0.0–0.9)
ENA SSB (LA) Ab: <0.2 AI (ref 0.0–0.9)

## 2022-01-18 LAB — HEMOGLOBIN A1C
Est. average glucose Bld gHb Est-mCnc: 120 mg/dL
Hgb A1c MFr Bld: 5.8 % — ABNORMAL HIGH (ref 4.8–5.6)

## 2022-01-18 LAB — VITAMIN B6: Vitamin B6: 69.1 ug/L — ABNORMAL HIGH (ref 3.4–65.2)

## 2022-01-18 LAB — VITAMIN B12: Vitamin B-12: 617 pg/mL (ref 232–1245)

## 2022-01-18 LAB — VITAMIN B1: Thiamine: 125.2 nmol/L (ref 66.5–200.0)

## 2022-01-18 LAB — TSH: TSH: 1.38 u[IU]/mL (ref 0.450–4.500)

## 2022-01-18 NOTE — Addendum Note (Signed)
Addended by: Andrey Spearman R on: 01/18/2022 01:32 PM   Modules accepted: Orders

## 2022-02-17 DIAGNOSIS — H43393 Other vitreous opacities, bilateral: Secondary | ICD-10-CM | POA: Diagnosis not present

## 2022-02-21 DIAGNOSIS — R7303 Prediabetes: Secondary | ICD-10-CM | POA: Diagnosis not present

## 2022-02-21 DIAGNOSIS — D7589 Other specified diseases of blood and blood-forming organs: Secondary | ICD-10-CM | POA: Diagnosis not present

## 2022-02-21 DIAGNOSIS — E559 Vitamin D deficiency, unspecified: Secondary | ICD-10-CM | POA: Diagnosis not present

## 2022-02-21 DIAGNOSIS — G629 Polyneuropathy, unspecified: Secondary | ICD-10-CM | POA: Diagnosis not present

## 2022-02-21 DIAGNOSIS — M8588 Other specified disorders of bone density and structure, other site: Secondary | ICD-10-CM | POA: Diagnosis not present

## 2022-02-21 DIAGNOSIS — E7800 Pure hypercholesterolemia, unspecified: Secondary | ICD-10-CM | POA: Diagnosis not present

## 2022-02-21 DIAGNOSIS — Z0000 Encounter for general adult medical examination without abnormal findings: Secondary | ICD-10-CM | POA: Diagnosis not present

## 2022-02-21 DIAGNOSIS — G250 Essential tremor: Secondary | ICD-10-CM | POA: Diagnosis not present

## 2022-02-21 DIAGNOSIS — H6123 Impacted cerumen, bilateral: Secondary | ICD-10-CM | POA: Diagnosis not present

## 2022-02-21 DIAGNOSIS — G4733 Obstructive sleep apnea (adult) (pediatric): Secondary | ICD-10-CM | POA: Diagnosis not present

## 2022-02-22 DIAGNOSIS — L820 Inflamed seborrheic keratosis: Secondary | ICD-10-CM | POA: Diagnosis not present

## 2022-02-22 DIAGNOSIS — Z85828 Personal history of other malignant neoplasm of skin: Secondary | ICD-10-CM | POA: Diagnosis not present

## 2022-02-22 DIAGNOSIS — Z08 Encounter for follow-up examination after completed treatment for malignant neoplasm: Secondary | ICD-10-CM | POA: Diagnosis not present

## 2022-02-22 DIAGNOSIS — Z789 Other specified health status: Secondary | ICD-10-CM | POA: Diagnosis not present

## 2022-02-22 DIAGNOSIS — L538 Other specified erythematous conditions: Secondary | ICD-10-CM | POA: Diagnosis not present

## 2022-02-22 DIAGNOSIS — R208 Other disturbances of skin sensation: Secondary | ICD-10-CM | POA: Diagnosis not present

## 2022-02-22 DIAGNOSIS — R229 Localized swelling, mass and lump, unspecified: Secondary | ICD-10-CM | POA: Diagnosis not present

## 2022-02-22 DIAGNOSIS — L298 Other pruritus: Secondary | ICD-10-CM | POA: Diagnosis not present

## 2022-02-22 DIAGNOSIS — L821 Other seborrheic keratosis: Secondary | ICD-10-CM | POA: Diagnosis not present

## 2022-02-22 DIAGNOSIS — D1721 Benign lipomatous neoplasm of skin and subcutaneous tissue of right arm: Secondary | ICD-10-CM | POA: Diagnosis not present

## 2022-02-22 DIAGNOSIS — C44329 Squamous cell carcinoma of skin of other parts of face: Secondary | ICD-10-CM | POA: Diagnosis not present

## 2022-02-28 ENCOUNTER — Ambulatory Visit: Payer: Medicare Other | Admitting: Internal Medicine

## 2022-02-28 ENCOUNTER — Encounter: Payer: Self-pay | Admitting: Internal Medicine

## 2022-02-28 ENCOUNTER — Other Ambulatory Visit: Payer: Self-pay

## 2022-02-28 VITALS — BP 102/65 | HR 63 | Resp 15 | Ht 67.25 in | Wt 193.0 lb

## 2022-02-28 DIAGNOSIS — G250 Essential tremor: Secondary | ICD-10-CM

## 2022-02-28 DIAGNOSIS — M2040 Other hammer toe(s) (acquired), unspecified foot: Secondary | ICD-10-CM | POA: Insufficient documentation

## 2022-02-28 DIAGNOSIS — N871 Moderate cervical dysplasia: Secondary | ICD-10-CM | POA: Insufficient documentation

## 2022-02-28 DIAGNOSIS — G4733 Obstructive sleep apnea (adult) (pediatric): Secondary | ICD-10-CM | POA: Insufficient documentation

## 2022-02-28 DIAGNOSIS — M8588 Other specified disorders of bone density and structure, other site: Secondary | ICD-10-CM | POA: Insufficient documentation

## 2022-02-28 DIAGNOSIS — S83209A Unspecified tear of unspecified meniscus, current injury, unspecified knee, initial encounter: Secondary | ICD-10-CM | POA: Insufficient documentation

## 2022-02-28 DIAGNOSIS — R768 Other specified abnormal immunological findings in serum: Secondary | ICD-10-CM | POA: Diagnosis not present

## 2022-02-28 DIAGNOSIS — E7800 Pure hypercholesterolemia, unspecified: Secondary | ICD-10-CM | POA: Insufficient documentation

## 2022-02-28 DIAGNOSIS — M4802 Spinal stenosis, cervical region: Secondary | ICD-10-CM | POA: Insufficient documentation

## 2022-02-28 DIAGNOSIS — G629 Polyneuropathy, unspecified: Secondary | ICD-10-CM

## 2022-02-28 DIAGNOSIS — R7303 Prediabetes: Secondary | ICD-10-CM | POA: Insufficient documentation

## 2022-02-28 DIAGNOSIS — M25561 Pain in right knee: Secondary | ICD-10-CM | POA: Diagnosis not present

## 2022-02-28 DIAGNOSIS — M25562 Pain in left knee: Secondary | ICD-10-CM | POA: Diagnosis not present

## 2022-02-28 DIAGNOSIS — D7589 Other specified diseases of blood and blood-forming organs: Secondary | ICD-10-CM | POA: Insufficient documentation

## 2022-02-28 DIAGNOSIS — G8929 Other chronic pain: Secondary | ICD-10-CM

## 2022-02-28 DIAGNOSIS — G2581 Restless legs syndrome: Secondary | ICD-10-CM | POA: Insufficient documentation

## 2022-02-28 DIAGNOSIS — K219 Gastro-esophageal reflux disease without esophagitis: Secondary | ICD-10-CM | POA: Insufficient documentation

## 2022-02-28 DIAGNOSIS — Z87442 Personal history of urinary calculi: Secondary | ICD-10-CM | POA: Insufficient documentation

## 2022-02-28 DIAGNOSIS — E669 Obesity, unspecified: Secondary | ICD-10-CM | POA: Insufficient documentation

## 2022-02-28 DIAGNOSIS — E559 Vitamin D deficiency, unspecified: Secondary | ICD-10-CM | POA: Insufficient documentation

## 2022-02-28 NOTE — Progress Notes (Signed)
? ?Office Visit Note ? ?Patient: Susan Norton             ?Date of Birth: 05-29-1951           ?MRN: [id number]             ?PCP: Kathyrn Lass, MD ?Referring: Penni Bombard, MD ?Visit Date: 02/28/2022 ? ? ?Subjective:  ?New Patient (Initial Visit) (Abnormal labs) ? ? ?History of Present Illness: Susan Norton is a 71 y.o. female here for evaluation of positive ANA associated with neuropathy. She is seeing neurology for longstanding hereditary essential tremors but more recently having probably neuropathy symptoms with sensitivity and tightness sensation affecting her feet. She estimates about 6 months since this started most noticeable at night and first thing in the morning her big toes especially are uncomfortable with any pressure on them and the forefoot area feels extremely tight. No particular discoloration, warmth, or swelling. ?She has joint pains with bilateral meniscal tears of her knees from falling since about 10 years ago. These were evaluated by orthopedic surgery with recommendation for conservative management unless she proceeded to TKA due to her amount of osteoarthritis. She exercises using a recumbent bike 5 days per week and feels her strength is pretty good. She has chronic bony changes in her fingers worst in right index finger no particular swelling and no numbness in the hands. ?For the past year she see episodic pallor in her fingers. This is usually from cold exposure and usually just one or two at a time and lasting about 5 minutes before color comes back. She has not seen any skin peeling, blistering, or residual pain in affected areas. No nail changes. ?She denies any oral ulcers or lymphadenopathy. She has itchy skin on her forearms without visible changes. Dry skin on her distal legs and feet also without other visible changes. ? ?Labs reviewed ?ANA 1:80 homogenous ?SSA, SSB neg ?ANCA neg ?SPEP wnl ?TSH wnl ? ?Activities of Daily Living:  ?Patient reports morning stiffness for 0   none .   ?Patient Denies nocturnal pain.  ?Difficulty dressing/grooming: Denies ?Difficulty climbing stairs: Reports ?Difficulty getting out of chair: Denies ?Difficulty using hands for taps, buttons, cutlery, and/or writing: Reports ? ?Review of Systems  ?Constitutional:  Negative for fatigue.  ?HENT:  Negative for mouth dryness.   ?Eyes:  Negative for dryness.  ?Respiratory:  Negative for shortness of breath.   ?Cardiovascular:  Negative for swelling in legs/feet.  ?Gastrointestinal:  Negative for constipation.  ?Endocrine: Negative for excessive thirst.  ?Genitourinary:  Negative for difficulty urinating.  ?Musculoskeletal:  Positive for joint pain, joint pain and joint swelling.  ?Skin:  Negative for rash.  ?Allergic/Immunologic: Negative for susceptible to infections.  ?Neurological:  Positive for tremors.  ?Hematological:  Negative for bruising/bleeding tendency.  ?Psychiatric/Behavioral:  Negative for sleep disturbance.   ? ?PMFS History:  ?Patient Active Problem List  ? Diagnosis Date Noted  ? Cervical intraepithelial neoplasia grade 2 02/28/2022  ? Gastro-esophageal reflux disease without esophagitis 02/28/2022  ? Hammer toe 02/28/2022  ? History of urinary stone 02/28/2022  ? Macrocytosis 02/28/2022  ? Obesity 02/28/2022  ? Prediabetes 02/28/2022  ? Pure hypercholesterolemia 02/28/2022  ? Restless legs syndrome 02/28/2022  ? Spinal stenosis in cervical region 02/28/2022  ? Torn meniscus 02/28/2022  ? Other specified disorders of bone density and structure, other site 02/28/2022  ? Obstructive sleep apnea (adult) (pediatric) 02/28/2022  ? Neuropathy 02/28/2022  ? Vitamin D deficiency 02/28/2022  ? Positive  ANA (antinuclear antibody) 02/28/2022  ? Peroneal tendinitis of right lower extremity 04/22/2018  ? Bilateral knee pain 03/25/2018  ? Essential tremor 12/26/2016  ?  ?Past Medical History:  ?Diagnosis Date  ? Neuropathy   ? Sleep apnea   ? CPAP  ? Torn meniscus   ? both knees  ? Torn tendon 2019  ? foot   ? Tremor   ?  ?Family History  ?Problem Relation Age of Onset  ? Atrial fibrillation Mother   ? Cancer Mother 31  ?     breast  ? Stroke Mother   ?     x 8  ? Hypertension Mother   ? Cancer Father   ?     prostate  ? ?Past Surgical History:  ?Procedure Laterality Date  ? ABDOMINAL SURGERY    ? small growth removed  ? KIDNEY STONE SURGERY    ? ?Social History  ? ?Social History Narrative  ? Lives alone  ? No caffeine  ? ?Immunization History  ?Administered Date(s) Administered  ? Fluad Quad(high Dose 65+) 10/03/2019  ? Influenza Split 09/17/2019, 09/12/2021  ? Influenza, High Dose Seasonal PF 01/16/2019  ? Influenza,inj,Quad PF,6+ Mos 12/20/2016, 12/27/2017  ? Influenza,inj,quad, With Preservative 12/02/2015  ? PFIZER Comirnaty(Gray Top)Covid-19 Tri-Sucrose Vaccine 09/16/2020, 03/17/2021  ? PFIZER(Purple Top)SARS-COV-2 Vaccination 01/21/2020, 02/11/2020, 09/16/2020, 03/17/2021  ? Pension scheme manager 66yr & up 09/12/2021  ? Pneumococcal Conjugate-13 12/27/2017  ? Pneumococcal Polysaccharide-23 01/16/2019  ? Tdap 12/20/2015  ? Zoster, Live 12/20/2015, 02/15/2021, 02/21/2022  ?  ? ?Objective: ?Vital Signs: BP 102/65 (BP Location: Right Arm, Patient Position: Sitting, Cuff Size: Normal)   Pulse 63   Resp 15   Ht 5' 7.25" (1.708 m)   Wt 193 lb (87.5 kg)   BMI 30.00 kg/m?   ? ?Physical Exam ?HENT:  ?   Right Ear: External ear normal.  ?   Left Ear: External ear normal.  ?   Mouth/Throat:  ?   Mouth: Mucous membranes are moist.  ?   Pharynx: Oropharynx is clear.  ?Eyes:  ?   Conjunctiva/sclera: Conjunctivae normal.  ?Cardiovascular:  ?   Rate and Rhythm: Normal rate and regular rhythm.  ?Pulmonary:  ?   Effort: Pulmonary effort is normal.  ?   Breath sounds: Normal breath sounds.  ?Musculoskeletal:  ?   Right lower leg: No edema.  ?   Left lower leg: No edema.  ?Lymphadenopathy:  ?   Cervical: No cervical adenopathy.  ?Skin: ?   General: Skin is warm and dry.  ?   Findings: No rash.   ?Neurological:  ?   Mental Status: She is alert.  ?   Deep Tendon Reflexes: Reflexes normal.  ?   Comments: Positional tremor of both hands  ?Psychiatric:     ?   Mood and Affect: Mood normal.  ?  ? ?Musculoskeletal Exam:  ?Elbows full ROM no tenderness or swelling ?Wrists full ROM no tenderness or swelling ?Fingers with distal heberdon's nodes throughout, right 2nd PIP with large nodule and soft tissue thickening with slightly reduced flexion ROM ?Knee crepitus and enlarged size no palpable effusions and ROM is grossly intact ?1st MTP reduced ROM particularly flexion, without large bony nodules or bunion, cocked up toe deformities in right 2nd-5th MTPs left foot more severe hammertoe abnormalities ? ? ?Investigation: ?No additional findings. ? ?Imaging: ?No results found. ? ?Recent Labs: ?Lab Results  ?Component Value Date  ? WBC 7.1 01/11/2022  ? HGB 13.4  01/11/2022  ? PLT 183 01/11/2022  ? NA 144 01/11/2022  ? K 4.4 01/11/2022  ? CL 106 01/11/2022  ? CO2 24 01/11/2022  ? GLUCOSE 95 01/11/2022  ? BUN 15 01/11/2022  ? CREATININE 0.68 01/11/2022  ? BILITOT 0.2 01/11/2022  ? ALKPHOS 65 01/11/2022  ? AST 19 01/11/2022  ? ALT 15 01/11/2022  ? PROT 6.5 01/11/2022  ? ALBUMIN 4.0 01/11/2022  ? CALCIUM 9.0 01/11/2022  ? ? ?Speciality Comments: No specialty comments available. ? ?Procedures:  ?No procedures performed ?Allergies: Patient has no known allergies.  ? ?Assessment / Plan:     ?Visit Diagnoses: Positive ANA (antinuclear antibody) - Plan: RNP Antibody, Anti-Smith antibody, Anti-DNA antibody, double-stranded, C3 and C4 ? ?Symptoms are pretty mild with some sensory changes in distal feet no visible changes and no deficits on exam. She has osteoarthritis of multiple joints including fingers and knees on exam without any obvious inflammatory changes. Will check additional antibody markers also serum complements but initial suspicion is lower for a systemic autoimmune problem. ? ?Essential tremor ? 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  She does not have any neuropathy complaints in her hands. ? ?Neuropathy ? ?Peripheral neuropathy of

## 2022-03-01 LAB — ANTI-SMITH ANTIBODY: ENA SM Ab Ser-aCnc: <1 AI

## 2022-03-01 LAB — RNP ANTIBODY: Ribonucleic Protein(ENA) Antibody, IgG: <1 AI

## 2022-03-01 LAB — ANTI-DNA ANTIBODY, DOUBLE-STRANDED: ds DNA Ab: <1 IU/mL

## 2022-03-01 LAB — C3 AND C4
C3 Complement: 114 mg/dL (ref 83–193)
C4 Complement: 29 mg/dL (ref 15–57)

## 2022-03-07 ENCOUNTER — Other Ambulatory Visit: Payer: Self-pay | Admitting: Family Medicine

## 2022-03-07 DIAGNOSIS — G4733 Obstructive sleep apnea (adult) (pediatric): Secondary | ICD-10-CM | POA: Diagnosis not present

## 2022-03-07 DIAGNOSIS — E7800 Pure hypercholesterolemia, unspecified: Secondary | ICD-10-CM

## 2022-03-13 ENCOUNTER — Encounter: Payer: Self-pay | Admitting: Internal Medicine

## 2022-03-14 ENCOUNTER — Ambulatory Visit
Admission: RE | Admit: 2022-03-14 | Discharge: 2022-03-14 | Disposition: A | Discharge status: Home / Self Care | Payer: No Typology Code available for payment source | Source: Ambulatory Visit | Attending: Family Medicine | Admitting: Family Medicine

## 2022-03-14 DIAGNOSIS — E7800 Pure hypercholesterolemia, unspecified: Secondary | ICD-10-CM

## 2022-03-22 DIAGNOSIS — R9431 Abnormal electrocardiogram [ECG] [EKG]: Secondary | ICD-10-CM | POA: Diagnosis not present

## 2022-04-06 DIAGNOSIS — S99929A Unspecified injury of unspecified foot, initial encounter: Secondary | ICD-10-CM | POA: Diagnosis not present

## 2022-04-30 DIAGNOSIS — H2513 Age-related nuclear cataract, bilateral: Secondary | ICD-10-CM | POA: Diagnosis not present

## 2022-04-30 DIAGNOSIS — H43813 Vitreous degeneration, bilateral: Secondary | ICD-10-CM | POA: Diagnosis not present

## 2022-04-30 DIAGNOSIS — H353221 Exudative age-related macular degeneration, left eye, with active choroidal neovascularization: Secondary | ICD-10-CM | POA: Diagnosis not present

## 2022-04-30 DIAGNOSIS — H353112 Nonexudative age-related macular degeneration, right eye, intermediate dry stage: Secondary | ICD-10-CM | POA: Diagnosis not present

## 2022-05-07 DIAGNOSIS — H353221 Exudative age-related macular degeneration, left eye, with active choroidal neovascularization: Secondary | ICD-10-CM | POA: Diagnosis not present

## 2022-05-30 DIAGNOSIS — G4733 Obstructive sleep apnea (adult) (pediatric): Secondary | ICD-10-CM | POA: Diagnosis not present

## 2022-06-03 IMAGING — CT CT CARDIAC CORONARY ARTERY CALCIUM SCORE
3 series · 14 of 20 positions shown, 16 images · non-contrast
Comparison: None available

CLINICAL DATA: 70-year-old white female with borderline
hyperlipidemia.

EXAM:
CT CARDIAC CORONARY ARTERY CALCIUM SCORE
TECHNIQUE: Non-contrast imaging through the heart was performed using
prospective ECG gating. Image post processing was performed on an
independent workstation, allowing for quantitative analysis of the
heart and coronary arteries. Note that this exam targets the heart
and the chest was not imaged in its entirety.

[Series 2: calcium scoring 2.00 qr36 bestdiast 70% hrt calciu · axial · 0.38mm/px · z∈[+1572,+1656]mm · 4 of 70 slices shown]
[im 14/70  vessel]
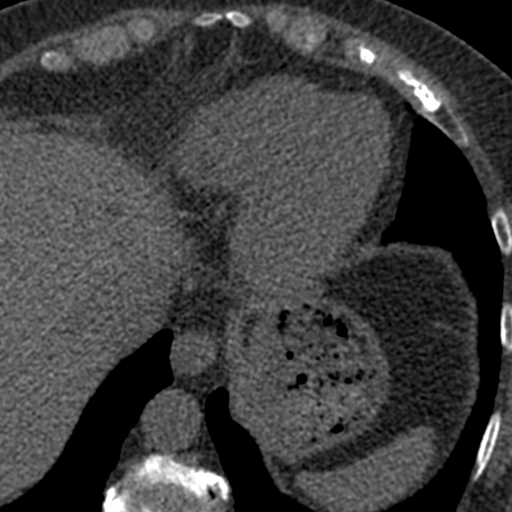
[im 28/70  vessel]
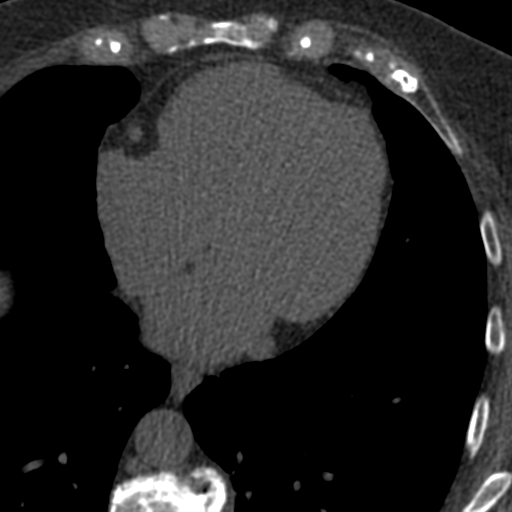
[im 42/70  vessel]
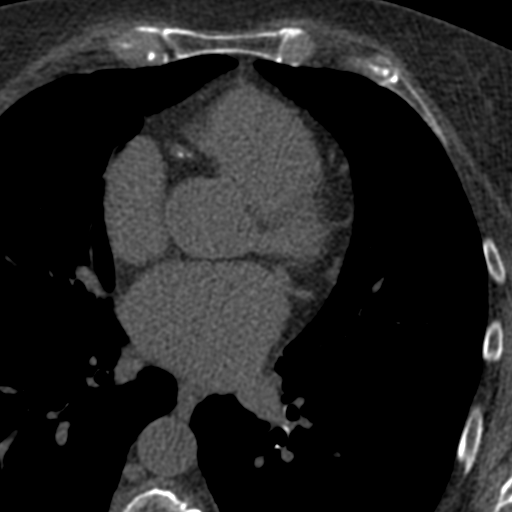
[im 56/70  vessel]
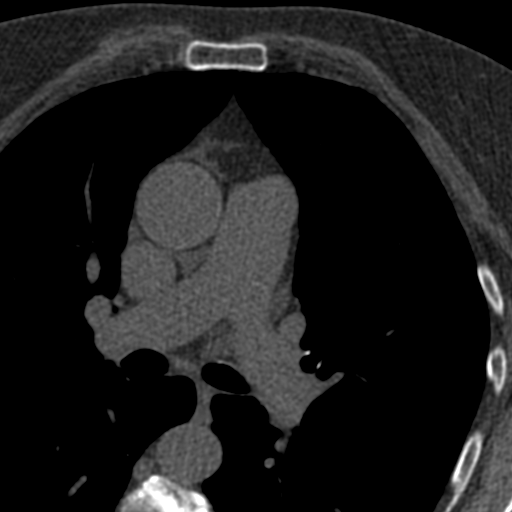

[Series 3: calcium scoring 2.00 br40 bestdiast 70% axial · axial · 0.56mm/px · z∈[+1568,+1660]mm · 5 of 70 slices shown, 7 images]
[im 12/70  vessel]
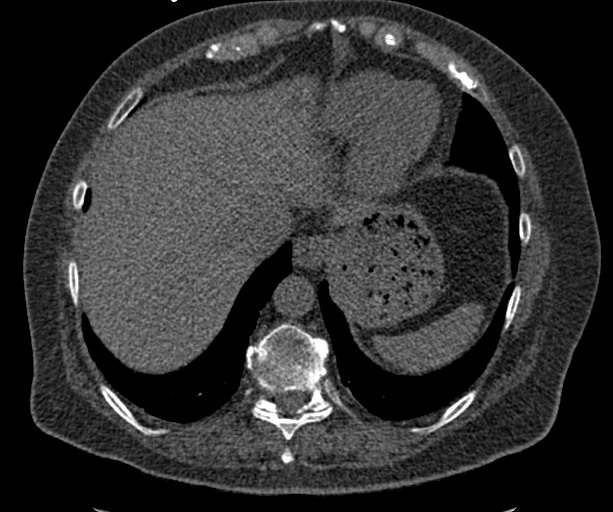
[im 12/70  lung]
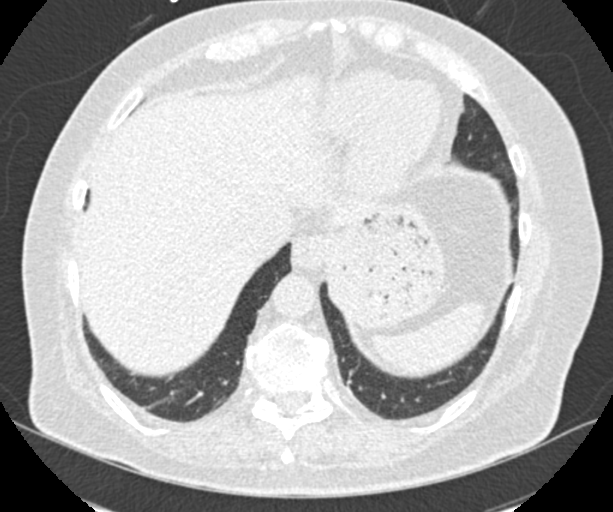
[im 24/70  vessel]
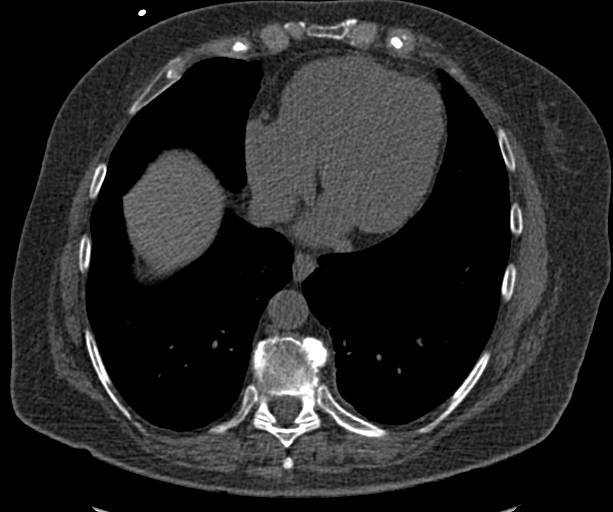
[im 35/70  vessel]
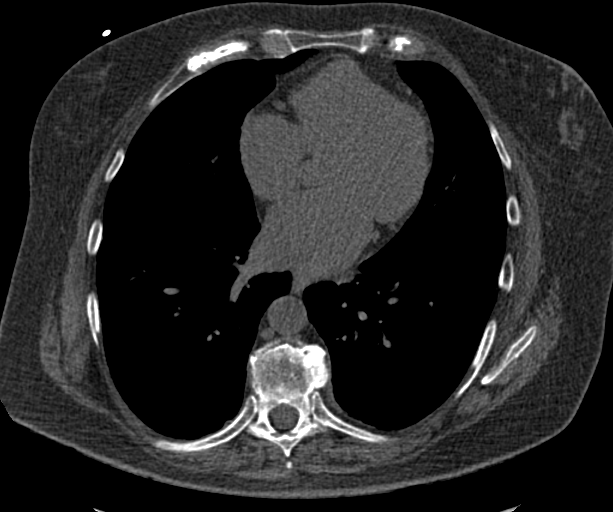
[im 47/70  vessel]
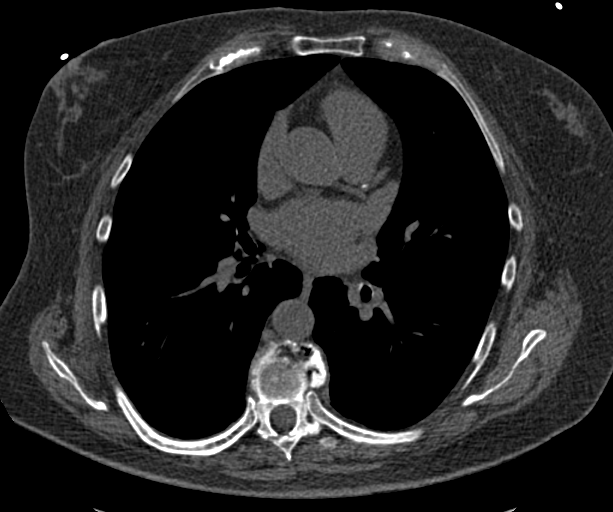
[im 58/70  vessel]
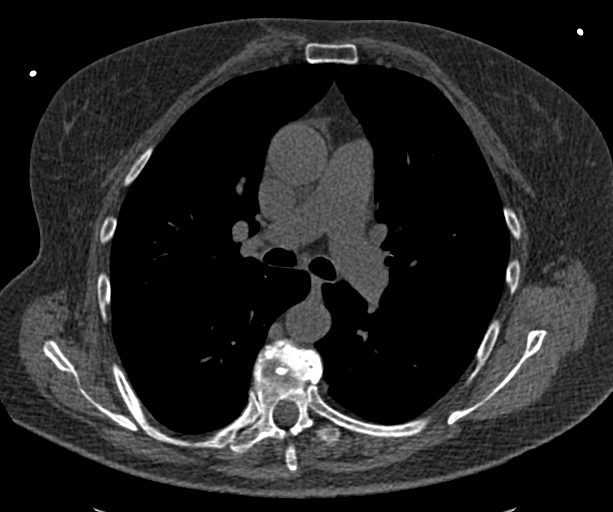
[im 58/70  lung]
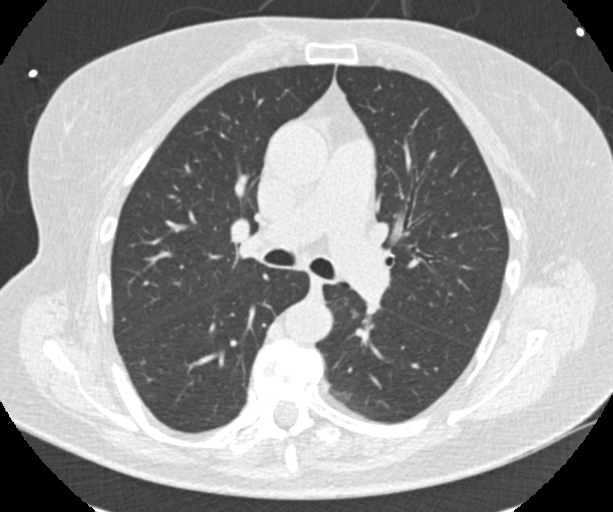

[Series 9: calcium scoring 2.00 br60 bestdiast 70% lungs · axial · 0.59mm/px · z∈[+1568,+1660]mm · 5 of 70 slices shown]
[im 12/70  vessel]
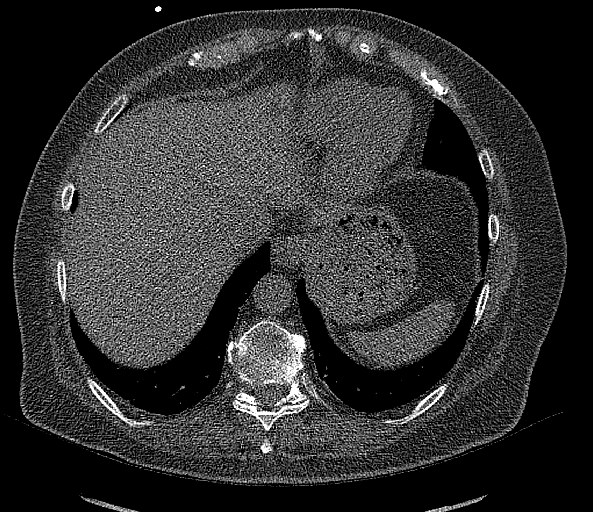
[im 24/70  vessel]
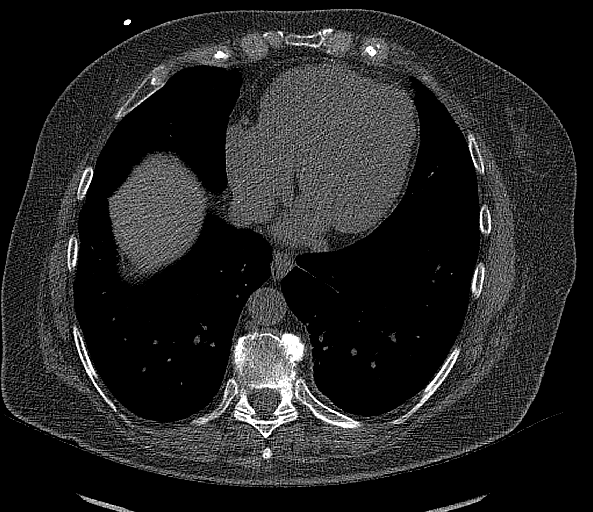
[im 35/70  vessel]
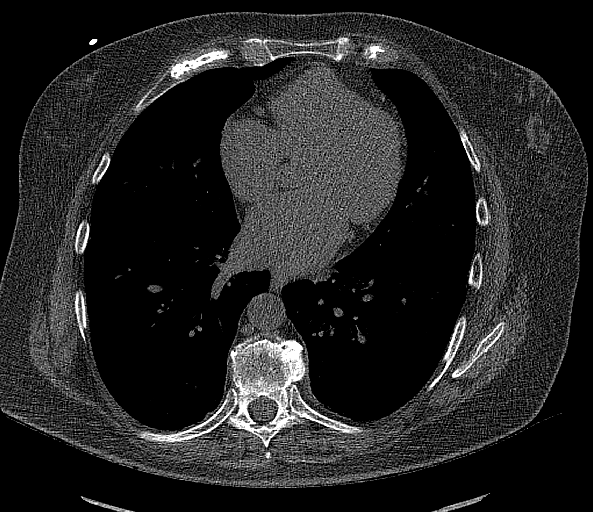
[im 47/70  vessel]
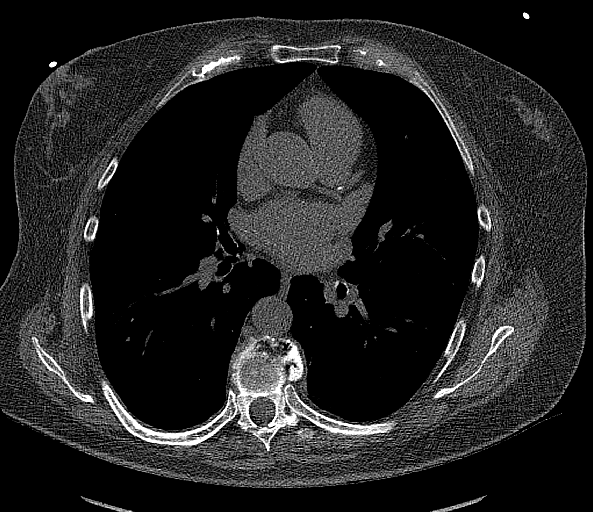
[im 58/70  vessel]
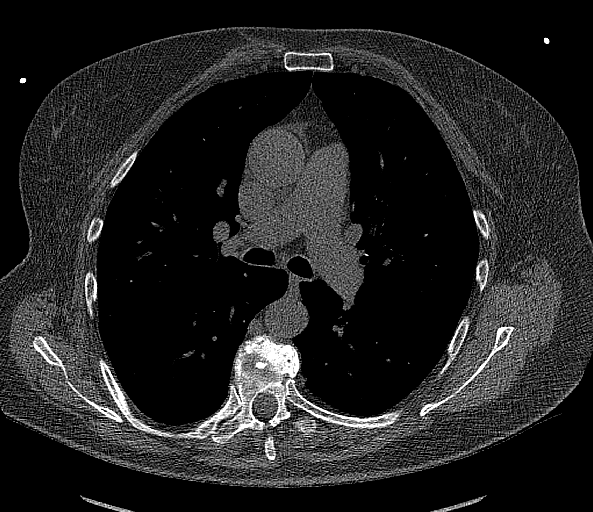

[14 of 20 positions shown; findings below may reference images not displayed]

FINDINGS: CORONARY CALCIUM SCORES:

Left Main: 0

LAD:

LCx:

RCA:

Total Agatston Score:

[HOSPITAL] percentile: 60

AORTA MEASUREMENTS:

Ascending Aorta: 33 mm

Descending Aorta: 23 mm

OTHER FINDINGS:

Heart size is normal. No significant pericardial effusion.
Visualized mediastinal structures are normal. Multiple hypodensities
in the liver are suggestive for cysts based on the Hounsfield units.
Index lesion in the anterior liver measures up to 1.8 cm. 2 mm
nodule in the right middle lobe on sequence 9 image 42. 3 mm
calcified granuloma in the right lower lobe on image 3. No large
areas of airspace disease or lung consolidation. No large pleural
effusions. There is some curvature in the thoracic spine with
bridging osteophytes.
IMPRESSION: 1. Coronary calcium score is 30.2 and this is at percentile 60 for
patients of the same age, gender and ethnicity.
2. Small lung nodules, largest measures 3 mm as described. No
follow-up needed if patient is low-risk. Non-contrast chest CT can
be considered in 12 months if patient is high-risk. This
recommendation follows the consensus statement: Guidelines for
Management of Incidental Pulmonary Nodules Detected on CT Images:
3. Probable hepatic cysts.

## 2022-06-05 DIAGNOSIS — Z006 Encounter for examination for normal comparison and control in clinical research program: Secondary | ICD-10-CM | POA: Diagnosis not present

## 2022-06-05 DIAGNOSIS — R001 Bradycardia, unspecified: Secondary | ICD-10-CM | POA: Diagnosis not present

## 2022-06-08 DIAGNOSIS — H43813 Vitreous degeneration, bilateral: Secondary | ICD-10-CM | POA: Diagnosis not present

## 2022-06-08 DIAGNOSIS — H353112 Nonexudative age-related macular degeneration, right eye, intermediate dry stage: Secondary | ICD-10-CM | POA: Diagnosis not present

## 2022-06-08 DIAGNOSIS — H353221 Exudative age-related macular degeneration, left eye, with active choroidal neovascularization: Secondary | ICD-10-CM | POA: Diagnosis not present

## 2022-06-21 DIAGNOSIS — E7800 Pure hypercholesterolemia, unspecified: Secondary | ICD-10-CM | POA: Diagnosis not present

## 2022-07-06 DIAGNOSIS — M62830 Muscle spasm of back: Secondary | ICD-10-CM | POA: Diagnosis not present

## 2022-07-06 DIAGNOSIS — H43813 Vitreous degeneration, bilateral: Secondary | ICD-10-CM | POA: Diagnosis not present

## 2022-07-06 DIAGNOSIS — M545 Low back pain, unspecified: Secondary | ICD-10-CM | POA: Diagnosis not present

## 2022-07-06 DIAGNOSIS — H353221 Exudative age-related macular degeneration, left eye, with active choroidal neovascularization: Secondary | ICD-10-CM | POA: Diagnosis not present

## 2022-07-06 DIAGNOSIS — H353112 Nonexudative age-related macular degeneration, right eye, intermediate dry stage: Secondary | ICD-10-CM | POA: Diagnosis not present

## 2022-08-06 ENCOUNTER — Encounter: Payer: Self-pay | Admitting: Diagnostic Neuroimaging

## 2022-08-06 ENCOUNTER — Ambulatory Visit: Payer: Medicare Other | Admitting: Diagnostic Neuroimaging

## 2022-08-06 VITALS — BP 100/60 | HR 62 | Ht 68.0 in | Wt 176.5 lb

## 2022-08-06 DIAGNOSIS — G250 Essential tremor: Secondary | ICD-10-CM

## 2022-08-06 DIAGNOSIS — G629 Polyneuropathy, unspecified: Secondary | ICD-10-CM

## 2022-08-06 MED ORDER — PROPRANOLOL HCL 40 MG PO TABS: 40.0000 mg | ORAL_TABLET | Freq: Two times a day (BID) | ORAL | 4 refills | Status: DC

## 2022-08-06 MED ORDER — PRIMIDONE 50 MG PO TABS: 150.0000 mg | ORAL_TABLET | Freq: Two times a day (BID) | ORAL | 4 refills | Status: DC

## 2022-08-06 NOTE — Progress Notes (Signed)
[facility]  PATIENT: Susan Norton DOB: Oct 13, 1951  REFERRING CLINICIAN: Kathyrn Lass, MD HISTORY FROM: patient  REASON FOR VISIT: follow up   HISTORICAL  CHIEF COMPLAINT:  Chief Complaint  Patient presents with   Follow-up    Pt is well. She states that she is feeling tightness in feet. She reports it wakes her up at night. Room 6 alone    HISTORY OF PRESENT ILLNESS:   UPDATE (08/06/22 , VRP): Since last visit, doing about the same. Now on clinical trial for ET with botox. Neuropathy stable --> some tight feeling in feet. No major pain.   UPDATE (12/04/21, VRP): Since last visit, doing about the same. No change in tremor with meds. Had 2nd opinion with Dr. Linus Mako at [facility]. ET confirmed. Offered DBS vs research study. Interested in neuromodulation device. Also with some numbness in toes t hat is new.   UPDATE (12/15/18, VRP): Since last visit, tremor has progressed in BUE (postural and action). Tolerating primidone '100mg'$  twice a day. No other alleviating or aggravating factors.     UPDATE 04/09/17: Since last visit, doing about the same. On primidone '50mg'$  BID. No side effects. Tremor mainly affects pt when she is measuring with cooking or applying makeup.    PRIOR HPI (12/26/16): 71 year old female here for evaluation of tremor. Patient has had postural and action tremor since age 43 years old. Patient's father had some tremor and several family members on her father's side had tremor. Patient was diagnosed with possible essential tremor. Over the last few years symptoms have slightly worsened. She denies any voice tremor or leg tremor. Tremor mainly affects her hands. Symptoms are worse when she is tired or nervous. This affects her when she is reaching for things, holding utensils or handwriting. Patient tried propranolol 40 mg daily for 1-2 months without relief. Patient has normal blood pressure but tends to run slightly on the low side.  REVIEW OF SYSTEMS: Full  14 system review of systems performed and negative with exception of: as per HPI.  ALLERGIES: No Known Allergies  HOME MEDICATIONS: Outpatient Medications Prior to Visit  Medication Sig Dispense Refill   Apoaequorin (PREVAGEN EXTRA STRENGTH) 20 MG CAPS      Boswellia-Glucosamine-Vit D (OSTEO BI-FLEX ONE PER DAY PO) Take by mouth.     Cholecalciferol (VITAMIN D3) 50 MCG (2000 UT) TABS Take by mouth.     Multiple Vitamins-Minerals (MULTIVITAMIN WITH MINERALS) tablet Take 1 tablet by mouth daily.     Multiple Vitamins-Minerals (PRESERVISION AREDS 2+MULTI VIT PO)      primidone (MYSOLINE) 50 MG tablet Take 3 tablets (150 mg total) by mouth in the morning and at bedtime. Take three (3) tablets by mouth twice (2) daily 540 tablet 4   propranolol (INDERAL) 40 MG tablet Take 1 tablet (40 mg total) by mouth 2 (two) times daily. 180 tablet 4   Omeprazole Magnesium (PRILOSEC OTC PO)      No facility-administered medications prior to visit.      PHYSICAL EXAM  GENERAL EXAM/CONSTITUTIONAL: Vitals:  Vitals:   08/06/22 1318  BP: 100/60  Pulse: 62  Weight: 176 lb 8 oz (80.1 kg)  Height: '5\' 8"'$  (1.727 m)   Body mass index is 26.84 kg/m. Wt Readings from Last 3 Encounters:  08/06/22 176 lb 8 oz (80.1 kg)  02/28/22 193 lb (87.5 kg)  12/04/21 208 lb (94.3 kg)   Patient is in no distress; well developed, nourished and groomed; neck is supple  CARDIOVASCULAR:  Examination of carotid arteries is normal; no carotid bruits Regular rate and rhythm, no murmurs Examination of peripheral vascular system by observation and palpation is normal  EYES: Ophthalmoscopic exam of optic discs and posterior segments is normal; no papilledema or hemorrhages No results found.  MUSCULOSKELETAL: Gait, strength, tone, movements noted in Neurologic exam below  NEUROLOGIC: MENTAL STATUS:      No data to display         awake, alert, oriented to person, place and time recent and remote memory  intact normal attention and concentration language fluent, comprehension intact, naming intact fund of knowledge appropriate  CRANIAL NERVE:  2nd - no papilledema on fundoscopic exam 2nd, 3rd, 4th, 6th - pupils equal and reactive to light, visual fields full to confrontation, extraocular muscles intact, no nystagmus 5th - facial sensation symmetric 7th - facial strength symmetric 8th - hearing intact 9th - palate elevates symmetrically, uvula midline 11th - shoulder shrug symmetric 12th - tongue protrusion midline  MOTOR:  POSTURAL AND ACTION TREMOR IN LUE > RUE normal bulk and tone, full strength in the BUE, BLE  SENSORY:  normal and symmetric to light touch, temperature, vibration  COORDINATION:  finger-nose-finger, fine finger movements normal  REFLEXES:  deep tendon reflexes present and symmetric  GAIT/STATION:  narrow based gait     DIAGNOSTIC DATA (LABS, IMAGING, TESTING) - I reviewed patient records, labs, notes, testing and imaging myself where available.  Lab Results  Component Value Date   WBC 7.1 01/11/2022   HGB 13.4 01/11/2022   HCT 39.8 01/11/2022   MCV 100 (H) 01/11/2022   PLT 183 01/11/2022      Component Value Date/Time   NA 144 01/11/2022 1640   K 4.4 01/11/2022 1640   CL 106 01/11/2022 1640   CO2 24 01/11/2022 1640   GLUCOSE 95 01/11/2022 1640   BUN 15 01/11/2022 1640   CREATININE 0.68 01/11/2022 1640   CALCIUM 9.0 01/11/2022 1640   PROT 6.5 01/11/2022 1640   ALBUMIN 4.0 01/11/2022 1640   AST 19 01/11/2022 1640   ALT 15 01/11/2022 1640   ALKPHOS 65 01/11/2022 1640   BILITOT 0.2 01/11/2022 1640   No results found for: "CHOL", "HDL", "LDLCALC", "LDLDIRECT", "TRIG", "CHOLHDL" Lab Results  Component Value Date   HGBA1C 5.8 (H) 01/11/2022   Lab Results  Component Value Date   XBDZHGDJ24 268 01/11/2022   Lab Results  Component Value Date   TSH 1.380 01/11/2022    01/11/22 EMG/NCS Abnormal study demonstrating: - Moderate to  severe, axonal sensorimotor polyneuropathy.    ASSESSMENT AND PLAN  71 y.o. year old female here with:  Dx:  1. Essential tremor   2. Neuropathy      PLAN:  ESSENTIAL TREMOR - primidone '150mg'$  twice a day - propranolol '40mg'$  twice a day - tried cala trio; not much benefit - now on clinical trial for botox of ET ([facility]) - consider gabapentin or topiramate  TINGLING IN FEET / NEUROPATHY - idiopathic neuropathy; consider gabapentin, lyrica, duloxetine if pain is significantly increased  Meds ordered this encounter  Medications   primidone (MYSOLINE) 50 MG tablet    Sig: Take 3 tablets (150 mg total) by mouth in the morning and at bedtime. Take three (3) tablets by mouth twice (2) daily    Dispense:  540 tablet    Refill:  4   propranolol (INDERAL) 40 MG tablet    Sig: Take 1 tablet (40 mg total) by mouth 2 (two) times daily.  Dispense:  180 tablet    Refill:  4   Return in about 1 year (around 08/07/2023) for MyChart visit (15 min).    Penni Bombard, MD 9/32/6712, 4:58 PM Certified in Neurology, Neurophysiology and Neuroimaging  [facility] [address], [place], [place] [address] [phone]

## 2022-08-10 DIAGNOSIS — H43813 Vitreous degeneration, bilateral: Secondary | ICD-10-CM | POA: Diagnosis not present

## 2022-08-10 DIAGNOSIS — H353112 Nonexudative age-related macular degeneration, right eye, intermediate dry stage: Secondary | ICD-10-CM | POA: Diagnosis not present

## 2022-08-10 DIAGNOSIS — H353221 Exudative age-related macular degeneration, left eye, with active choroidal neovascularization: Secondary | ICD-10-CM | POA: Diagnosis not present

## 2022-08-24 DIAGNOSIS — H353112 Nonexudative age-related macular degeneration, right eye, intermediate dry stage: Secondary | ICD-10-CM | POA: Diagnosis not present

## 2022-08-27 DIAGNOSIS — G4733 Obstructive sleep apnea (adult) (pediatric): Secondary | ICD-10-CM | POA: Diagnosis not present

## 2022-09-18 DIAGNOSIS — H353112 Nonexudative age-related macular degeneration, right eye, intermediate dry stage: Secondary | ICD-10-CM | POA: Diagnosis not present

## 2022-09-18 DIAGNOSIS — H353221 Exudative age-related macular degeneration, left eye, with active choroidal neovascularization: Secondary | ICD-10-CM | POA: Diagnosis not present

## 2022-09-23 DIAGNOSIS — H353112 Nonexudative age-related macular degeneration, right eye, intermediate dry stage: Secondary | ICD-10-CM | POA: Diagnosis not present

## 2022-10-23 DIAGNOSIS — H353112 Nonexudative age-related macular degeneration, right eye, intermediate dry stage: Secondary | ICD-10-CM | POA: Diagnosis not present

## 2022-11-01 DIAGNOSIS — G4733 Obstructive sleep apnea (adult) (pediatric): Secondary | ICD-10-CM | POA: Diagnosis not present

## 2022-11-13 DIAGNOSIS — H353221 Exudative age-related macular degeneration, left eye, with active choroidal neovascularization: Secondary | ICD-10-CM | POA: Diagnosis not present

## 2022-11-22 DIAGNOSIS — H353112 Nonexudative age-related macular degeneration, right eye, intermediate dry stage: Secondary | ICD-10-CM | POA: Diagnosis not present

## 2022-11-23 DIAGNOSIS — G4733 Obstructive sleep apnea (adult) (pediatric): Secondary | ICD-10-CM | POA: Diagnosis not present

## 2022-11-27 DIAGNOSIS — G4733 Obstructive sleep apnea (adult) (pediatric): Secondary | ICD-10-CM | POA: Diagnosis not present

## 2022-12-03 DIAGNOSIS — Z1231 Encounter for screening mammogram for malignant neoplasm of breast: Secondary | ICD-10-CM | POA: Diagnosis not present

## 2022-12-04 DIAGNOSIS — R208 Other disturbances of skin sensation: Secondary | ICD-10-CM | POA: Diagnosis not present

## 2022-12-04 DIAGNOSIS — R229 Localized swelling, mass and lump, unspecified: Secondary | ICD-10-CM | POA: Diagnosis not present

## 2022-12-04 DIAGNOSIS — B078 Other viral warts: Secondary | ICD-10-CM | POA: Diagnosis not present

## 2022-12-04 DIAGNOSIS — Z85828 Personal history of other malignant neoplasm of skin: Secondary | ICD-10-CM | POA: Diagnosis not present

## 2022-12-04 DIAGNOSIS — Z08 Encounter for follow-up examination after completed treatment for malignant neoplasm: Secondary | ICD-10-CM | POA: Diagnosis not present

## 2022-12-04 DIAGNOSIS — L578 Other skin changes due to chronic exposure to nonionizing radiation: Secondary | ICD-10-CM | POA: Diagnosis not present

## 2022-12-04 DIAGNOSIS — L570 Actinic keratosis: Secondary | ICD-10-CM | POA: Diagnosis not present

## 2022-12-04 DIAGNOSIS — L821 Other seborrheic keratosis: Secondary | ICD-10-CM | POA: Diagnosis not present

## 2022-12-04 DIAGNOSIS — C44329 Squamous cell carcinoma of skin of other parts of face: Secondary | ICD-10-CM | POA: Diagnosis not present

## 2022-12-22 DIAGNOSIS — H353112 Nonexudative age-related macular degeneration, right eye, intermediate dry stage: Secondary | ICD-10-CM | POA: Diagnosis not present

## 2022-12-27 DIAGNOSIS — M85852 Other specified disorders of bone density and structure, left thigh: Secondary | ICD-10-CM | POA: Diagnosis not present

## 2022-12-27 DIAGNOSIS — Z780 Asymptomatic menopausal state: Secondary | ICD-10-CM | POA: Diagnosis not present

## 2022-12-27 DIAGNOSIS — M85851 Other specified disorders of bone density and structure, right thigh: Secondary | ICD-10-CM | POA: Diagnosis not present

## 2022-12-28 DIAGNOSIS — H353112 Nonexudative age-related macular degeneration, right eye, intermediate dry stage: Secondary | ICD-10-CM | POA: Diagnosis not present

## 2022-12-28 DIAGNOSIS — H43813 Vitreous degeneration, bilateral: Secondary | ICD-10-CM | POA: Diagnosis not present

## 2022-12-28 DIAGNOSIS — H353221 Exudative age-related macular degeneration, left eye, with active choroidal neovascularization: Secondary | ICD-10-CM | POA: Diagnosis not present

## 2023-01-21 DIAGNOSIS — H353112 Nonexudative age-related macular degeneration, right eye, intermediate dry stage: Secondary | ICD-10-CM | POA: Diagnosis not present

## 2023-02-08 DIAGNOSIS — Z79899 Other long term (current) drug therapy: Secondary | ICD-10-CM | POA: Diagnosis not present

## 2023-02-08 DIAGNOSIS — M810 Age-related osteoporosis without current pathological fracture: Secondary | ICD-10-CM | POA: Diagnosis not present

## 2023-02-08 DIAGNOSIS — E7800 Pure hypercholesterolemia, unspecified: Secondary | ICD-10-CM | POA: Diagnosis not present

## 2023-02-13 DIAGNOSIS — G4733 Obstructive sleep apnea (adult) (pediatric): Secondary | ICD-10-CM | POA: Diagnosis not present

## 2023-02-20 DIAGNOSIS — H353112 Nonexudative age-related macular degeneration, right eye, intermediate dry stage: Secondary | ICD-10-CM | POA: Diagnosis not present

## 2023-02-22 DIAGNOSIS — H353221 Exudative age-related macular degeneration, left eye, with active choroidal neovascularization: Secondary | ICD-10-CM | POA: Diagnosis not present

## 2023-03-07 DIAGNOSIS — Z0000 Encounter for general adult medical examination without abnormal findings: Secondary | ICD-10-CM | POA: Diagnosis not present

## 2023-03-08 DIAGNOSIS — E7800 Pure hypercholesterolemia, unspecified: Secondary | ICD-10-CM | POA: Diagnosis not present

## 2023-03-08 DIAGNOSIS — I700 Atherosclerosis of aorta: Secondary | ICD-10-CM | POA: Diagnosis not present

## 2023-03-08 DIAGNOSIS — G250 Essential tremor: Secondary | ICD-10-CM | POA: Diagnosis not present

## 2023-03-08 DIAGNOSIS — Z79899 Other long term (current) drug therapy: Secondary | ICD-10-CM | POA: Diagnosis not present

## 2023-03-08 DIAGNOSIS — M8580 Other specified disorders of bone density and structure, unspecified site: Secondary | ICD-10-CM | POA: Diagnosis not present

## 2023-03-08 DIAGNOSIS — R7303 Prediabetes: Secondary | ICD-10-CM | POA: Diagnosis not present

## 2023-03-08 DIAGNOSIS — R2689 Other abnormalities of gait and mobility: Secondary | ICD-10-CM | POA: Diagnosis not present

## 2023-03-22 DIAGNOSIS — H353112 Nonexudative age-related macular degeneration, right eye, intermediate dry stage: Secondary | ICD-10-CM | POA: Diagnosis not present

## 2023-04-12 DIAGNOSIS — R262 Difficulty in walking, not elsewhere classified: Secondary | ICD-10-CM | POA: Diagnosis not present

## 2023-04-17 DIAGNOSIS — R262 Difficulty in walking, not elsewhere classified: Secondary | ICD-10-CM | POA: Diagnosis not present

## 2023-04-21 DIAGNOSIS — H353112 Nonexudative age-related macular degeneration, right eye, intermediate dry stage: Secondary | ICD-10-CM | POA: Diagnosis not present

## 2023-04-22 DIAGNOSIS — R262 Difficulty in walking, not elsewhere classified: Secondary | ICD-10-CM | POA: Diagnosis not present

## 2023-05-03 DIAGNOSIS — R262 Difficulty in walking, not elsewhere classified: Secondary | ICD-10-CM | POA: Diagnosis not present

## 2023-05-07 DIAGNOSIS — H43813 Vitreous degeneration, bilateral: Secondary | ICD-10-CM | POA: Diagnosis not present

## 2023-05-07 DIAGNOSIS — H353112 Nonexudative age-related macular degeneration, right eye, intermediate dry stage: Secondary | ICD-10-CM | POA: Diagnosis not present

## 2023-05-07 DIAGNOSIS — H353221 Exudative age-related macular degeneration, left eye, with active choroidal neovascularization: Secondary | ICD-10-CM | POA: Diagnosis not present

## 2023-05-10 DIAGNOSIS — R262 Difficulty in walking, not elsewhere classified: Secondary | ICD-10-CM | POA: Diagnosis not present

## 2023-05-14 DIAGNOSIS — G250 Essential tremor: Secondary | ICD-10-CM | POA: Diagnosis not present

## 2023-05-14 DIAGNOSIS — G249 Dystonia, unspecified: Secondary | ICD-10-CM | POA: Diagnosis not present

## 2023-05-15 DIAGNOSIS — G4733 Obstructive sleep apnea (adult) (pediatric): Secondary | ICD-10-CM | POA: Diagnosis not present

## 2023-06-25 DIAGNOSIS — H353221 Exudative age-related macular degeneration, left eye, with active choroidal neovascularization: Secondary | ICD-10-CM | POA: Diagnosis not present

## 2023-06-25 DIAGNOSIS — Z85828 Personal history of other malignant neoplasm of skin: Secondary | ICD-10-CM | POA: Diagnosis not present

## 2023-06-25 DIAGNOSIS — Z08 Encounter for follow-up examination after completed treatment for malignant neoplasm: Secondary | ICD-10-CM | POA: Diagnosis not present

## 2023-06-25 DIAGNOSIS — C44329 Squamous cell carcinoma of skin of other parts of face: Secondary | ICD-10-CM | POA: Diagnosis not present

## 2023-06-25 DIAGNOSIS — L814 Other melanin hyperpigmentation: Secondary | ICD-10-CM | POA: Diagnosis not present

## 2023-06-25 DIAGNOSIS — R229 Localized swelling, mass and lump, unspecified: Secondary | ICD-10-CM | POA: Diagnosis not present

## 2023-06-25 DIAGNOSIS — L821 Other seborrheic keratosis: Secondary | ICD-10-CM | POA: Diagnosis not present

## 2023-06-25 DIAGNOSIS — D229 Melanocytic nevi, unspecified: Secondary | ICD-10-CM | POA: Diagnosis not present

## 2023-07-26 DIAGNOSIS — H6123 Impacted cerumen, bilateral: Secondary | ICD-10-CM | POA: Diagnosis not present

## 2023-08-05 ENCOUNTER — Encounter: Payer: Self-pay | Admitting: Diagnostic Neuroimaging

## 2023-08-05 ENCOUNTER — Telehealth: Payer: Medicare Other | Admitting: Diagnostic Neuroimaging

## 2023-08-05 ENCOUNTER — Other Ambulatory Visit: Payer: Self-pay | Admitting: Neurology

## 2023-08-05 DIAGNOSIS — G250 Essential tremor: Secondary | ICD-10-CM | POA: Diagnosis not present

## 2023-08-05 MED ORDER — PRIMIDONE 50 MG PO TABS: 150.0000 mg | ORAL_TABLET | Freq: Every day | ORAL | 4 refills | Status: DC

## 2023-08-05 MED ORDER — PROPRANOLOL HCL 40 MG PO TABS: 40.0000 mg | ORAL_TABLET | Freq: Every day | ORAL | 4 refills | Status: DC

## 2023-08-05 NOTE — Progress Notes (Signed)
[facility]  PATIENT: Susan Norton DOB: [birth year]  REFERRING CLINICIAN: Sigmund Hazel, Norton HISTORY FROM: patient  REASON FOR VISIT: follow up   HISTORICAL  CHIEF COMPLAINT:  Chief Complaint  Patient presents with   Tremors    HISTORY OF PRESENT ILLNESS:   UPDATE (08/05/23, VRP): Since last visit, doing well. Norton botox for ET (outside of trial). Tolerating meds. Only taking once daily dosing of propranolol and primidone.   UPDATE (08/06/22 , VRP): Since last visit, doing about the same. Now Norton clinical trial for ET with botox. Neuropathy stable --> some tight feeling in feet. No major pain.   UPDATE (12/04/21, VRP): Since last visit, doing about the same. No change in tremor with meds. Had 2nd opinion with Dr. Rubin Payor at [facility]. ET confirmed. Offered DBS vs research study. Interested in neuromodulation device. Also with some numbness in toes t hat is new.   UPDATE (12/15/18, VRP): Since last visit, tremor has progressed in BUE (postural and action). Tolerating primidone 100mg  twice a day. No other alleviating or aggravating factors.     UPDATE 04/09/17: Since last visit, doing about the same. Norton primidone 50mg  BID. No side effects. Tremor mainly affects pt when she is measuring with cooking or applying makeup.    PRIOR HPI (12/26/16): 72 year old female here for evaluation of tremor. Patient has had postural and action tremor since age 79 years old. Patient's father had some tremor and several family members Norton her father's side had tremor. Patient was diagnosed with possible essential tremor. Over the last few years symptoms have slightly worsened. She denies any voice tremor or leg tremor. Tremor mainly affects her hands. Symptoms are worse when she is tired or nervous. This affects her when she is reaching for things, holding utensils or handwriting. Patient tried propranolol 40 mg daily for 1-2 months without relief. Patient has normal blood pressure but tends to run  slightly Norton the low side.  REVIEW OF SYSTEMS: Full 14 system review of systems performed and negative with exception of: as per HPI.  ALLERGIES: No Known Allergies  HOME MEDICATIONS: Outpatient Medications Prior to Visit  Medication Sig Dispense Refill   primidone (MYSOLINE) 50 MG tablet Take 3 tablets (150 mg total) by mouth in the morning and at bedtime. Take three (3) tablets by mouth twice (2) daily (Patient taking differently: Take 150 mg by mouth daily. Take three (3) tablets by mouth twice (2) daily) 540 tablet 4   propranolol (INDERAL) 40 MG tablet Take 1 tablet (40 mg total) by mouth 2 (two) times daily. (Patient taking differently: Take 40 mg by mouth daily.) 180 tablet 4   Apoaequorin (PREVAGEN EXTRA STRENGTH) 20 MG CAPS      Boswellia-Glucosamine-Vit D (OSTEO BI-FLEX ONE PER DAY PO) Take by mouth.     Cholecalciferol (VITAMIN D3) 50 MCG (2000 UT) TABS Take by mouth.     Multiple Vitamins-Minerals (MULTIVITAMIN WITH MINERALS) tablet Take 1 tablet by mouth daily.     Multiple Vitamins-Minerals (PRESERVISION AREDS 2+MULTI VIT PO)      No facility-administered medications prior to visit.      PHYSICAL EXAM  Video visit     DIAGNOSTIC DATA (LABS, IMAGING, TESTING) - I reviewed patient records, labs, notes, testing and imaging myself where available.  Lab Results  Component Value Date   WBC 7.1 01/11/2022   HGB 13.4 01/11/2022   HCT 39.8 01/11/2022   MCV 100 (H) 01/11/2022   PLT 183 01/11/2022  Component Value Date/Time   NA 144 01/11/2022 1640   K 4.4 01/11/2022 1640   CL 106 01/11/2022 1640   CO2 24 01/11/2022 1640   GLUCOSE 95 01/11/2022 1640   BUN 15 01/11/2022 1640   CREATININE 0.68 01/11/2022 1640   CALCIUM 9.0 01/11/2022 1640   PROT 6.5 01/11/2022 1640   ALBUMIN 4.0 01/11/2022 1640   AST 19 01/11/2022 1640   ALT 15 01/11/2022 1640   ALKPHOS 65 01/11/2022 1640   BILITOT 0.2 01/11/2022 1640   No results found for: "CHOL", "HDL", "LDLCALC",  "LDLDIRECT", "TRIG", "CHOLHDL" Lab Results  Component Value Date   HGBA1C 5.8 (H) 01/11/2022   Lab Results  Component Value Date   VITAMINB12 617 01/11/2022   Lab Results  Component Value Date   TSH 1.380 01/11/2022    01/11/22 EMG/NCS Abnormal study demonstrating: - Moderate to severe, axonal sensorimotor polyneuropathy.    ASSESSMENT AND PLAN  72 y.o. year old female here with:  Dx:  1. Essential tremor       PLAN:  ESSENTIAL TREMOR - primidone 150mg  daily - propranolol 40mg  daily  - tried cala trio; not much benefit - continue botox for ET ([facility]) - consider gabapentin or topiramate  TINGLING IN FEET / NEUROPATHY - idiopathic neuropathy; consider gabapentin, lyrica, duloxetine if pain is significantly increased  Meds ordered this encounter  Medications   propranolol (INDERAL) 40 MG tablet    Sig: Take 1 tablet (40 mg total) by mouth daily.    Dispense:  90 tablet    Refill:  4   primidone (MYSOLINE) 50 MG tablet    Sig: Take 3 tablets (150 mg total) by mouth daily. Take three (3) tablets by mouth twice (2) daily    Dispense:  270 tablet    Refill:  4   Return in about 1 year (around 08/04/2024).  Virtual Visit via Video Note  I connected with Susan Norton 08/05/23 at  2:00 PM EDT by a video enabled telemedicine application and verified that I am speaking with the correct person using two identifiers.   I discussed the limitations of evaluation and management by telemedicine and the availability of in person appointments. The patient expressed understanding and agreed to proceed.  Patient is at home and I am at the office.   I spent 15 minutes of face-to-face and non-face-to-face time with patient.  This included previsit chart review, lab review, study review, order entry, electronic health record documentation, patient education.      Susan Norton 08/05/2023, 2:21 PM Certified in Neurology, Neurophysiology and Neuroimaging  [facility] [address], [address] [place], [place] [address] [phone]

## 2023-08-07 DIAGNOSIS — H43813 Vitreous degeneration, bilateral: Secondary | ICD-10-CM | POA: Diagnosis not present

## 2023-08-13 DIAGNOSIS — H353221 Exudative age-related macular degeneration, left eye, with active choroidal neovascularization: Secondary | ICD-10-CM | POA: Diagnosis not present

## 2023-08-19 ENCOUNTER — Other Ambulatory Visit: Payer: Self-pay | Admitting: Diagnostic Neuroimaging

## 2023-09-05 DIAGNOSIS — G250 Essential tremor: Secondary | ICD-10-CM | POA: Diagnosis not present

## 2023-09-05 DIAGNOSIS — G249 Dystonia, unspecified: Secondary | ICD-10-CM | POA: Diagnosis not present

## 2023-10-04 DIAGNOSIS — H353112 Nonexudative age-related macular degeneration, right eye, intermediate dry stage: Secondary | ICD-10-CM | POA: Diagnosis not present

## 2023-10-04 DIAGNOSIS — H353221 Exudative age-related macular degeneration, left eye, with active choroidal neovascularization: Secondary | ICD-10-CM | POA: Diagnosis not present

## 2023-10-04 DIAGNOSIS — H43813 Vitreous degeneration, bilateral: Secondary | ICD-10-CM | POA: Diagnosis not present

## 2023-12-09 DIAGNOSIS — H353221 Exudative age-related macular degeneration, left eye, with active choroidal neovascularization: Secondary | ICD-10-CM | POA: Diagnosis not present

## 2023-12-30 DIAGNOSIS — Z1231 Encounter for screening mammogram for malignant neoplasm of breast: Secondary | ICD-10-CM | POA: Diagnosis not present

## 2024-01-08 DIAGNOSIS — G250 Essential tremor: Secondary | ICD-10-CM | POA: Diagnosis not present

## 2024-01-08 DIAGNOSIS — G249 Dystonia, unspecified: Secondary | ICD-10-CM | POA: Diagnosis not present

## 2024-01-14 DIAGNOSIS — H353112 Nonexudative age-related macular degeneration, right eye, intermediate dry stage: Secondary | ICD-10-CM | POA: Diagnosis not present

## 2024-01-14 DIAGNOSIS — H353221 Exudative age-related macular degeneration, left eye, with active choroidal neovascularization: Secondary | ICD-10-CM | POA: Diagnosis not present

## 2024-01-14 DIAGNOSIS — H43813 Vitreous degeneration, bilateral: Secondary | ICD-10-CM | POA: Diagnosis not present

## 2024-01-17 DIAGNOSIS — H43813 Vitreous degeneration, bilateral: Secondary | ICD-10-CM | POA: Diagnosis not present

## 2024-01-17 DIAGNOSIS — H353221 Exudative age-related macular degeneration, left eye, with active choroidal neovascularization: Secondary | ICD-10-CM | POA: Diagnosis not present

## 2024-01-17 DIAGNOSIS — H353112 Nonexudative age-related macular degeneration, right eye, intermediate dry stage: Secondary | ICD-10-CM | POA: Diagnosis not present

## 2024-01-21 DIAGNOSIS — H43813 Vitreous degeneration, bilateral: Secondary | ICD-10-CM | POA: Diagnosis not present

## 2024-01-21 DIAGNOSIS — H353221 Exudative age-related macular degeneration, left eye, with active choroidal neovascularization: Secondary | ICD-10-CM | POA: Diagnosis not present

## 2024-01-21 DIAGNOSIS — H353112 Nonexudative age-related macular degeneration, right eye, intermediate dry stage: Secondary | ICD-10-CM | POA: Diagnosis not present

## 2024-02-11 DIAGNOSIS — H353221 Exudative age-related macular degeneration, left eye, with active choroidal neovascularization: Secondary | ICD-10-CM | POA: Diagnosis not present

## 2024-02-11 DIAGNOSIS — H353112 Nonexudative age-related macular degeneration, right eye, intermediate dry stage: Secondary | ICD-10-CM | POA: Diagnosis not present

## 2024-02-11 DIAGNOSIS — H43813 Vitreous degeneration, bilateral: Secondary | ICD-10-CM | POA: Diagnosis not present

## 2024-03-09 DIAGNOSIS — R7303 Prediabetes: Secondary | ICD-10-CM | POA: Diagnosis not present

## 2024-03-09 DIAGNOSIS — E7800 Pure hypercholesterolemia, unspecified: Secondary | ICD-10-CM | POA: Diagnosis not present

## 2024-03-09 DIAGNOSIS — Z79899 Other long term (current) drug therapy: Secondary | ICD-10-CM | POA: Diagnosis not present

## 2024-03-09 DIAGNOSIS — Z23 Encounter for immunization: Secondary | ICD-10-CM | POA: Diagnosis not present

## 2024-03-09 DIAGNOSIS — Z0000 Encounter for general adult medical examination without abnormal findings: Secondary | ICD-10-CM | POA: Diagnosis not present

## 2024-03-11 DIAGNOSIS — I2510 Atherosclerotic heart disease of native coronary artery without angina pectoris: Secondary | ICD-10-CM | POA: Diagnosis not present

## 2024-03-11 DIAGNOSIS — E7800 Pure hypercholesterolemia, unspecified: Secondary | ICD-10-CM | POA: Diagnosis not present

## 2024-03-11 DIAGNOSIS — M8580 Other specified disorders of bone density and structure, unspecified site: Secondary | ICD-10-CM | POA: Diagnosis not present

## 2024-03-20 DIAGNOSIS — H353221 Exudative age-related macular degeneration, left eye, with active choroidal neovascularization: Secondary | ICD-10-CM | POA: Diagnosis not present

## 2024-03-20 DIAGNOSIS — H2513 Age-related nuclear cataract, bilateral: Secondary | ICD-10-CM | POA: Diagnosis not present

## 2024-03-20 DIAGNOSIS — H43813 Vitreous degeneration, bilateral: Secondary | ICD-10-CM | POA: Diagnosis not present

## 2024-03-20 DIAGNOSIS — H353112 Nonexudative age-related macular degeneration, right eye, intermediate dry stage: Secondary | ICD-10-CM | POA: Diagnosis not present

## 2024-04-20 DIAGNOSIS — H353221 Exudative age-related macular degeneration, left eye, with active choroidal neovascularization: Secondary | ICD-10-CM | POA: Diagnosis not present

## 2024-04-20 DIAGNOSIS — H43813 Vitreous degeneration, bilateral: Secondary | ICD-10-CM | POA: Diagnosis not present

## 2024-04-20 DIAGNOSIS — H2513 Age-related nuclear cataract, bilateral: Secondary | ICD-10-CM | POA: Diagnosis not present

## 2024-04-20 DIAGNOSIS — H353112 Nonexudative age-related macular degeneration, right eye, intermediate dry stage: Secondary | ICD-10-CM | POA: Diagnosis not present

## 2024-04-22 DIAGNOSIS — G250 Essential tremor: Secondary | ICD-10-CM | POA: Diagnosis not present

## 2024-04-24 DIAGNOSIS — H2512 Age-related nuclear cataract, left eye: Secondary | ICD-10-CM | POA: Diagnosis not present

## 2024-04-24 DIAGNOSIS — H25013 Cortical age-related cataract, bilateral: Secondary | ICD-10-CM | POA: Diagnosis not present

## 2024-04-24 DIAGNOSIS — H353112 Nonexudative age-related macular degeneration, right eye, intermediate dry stage: Secondary | ICD-10-CM | POA: Diagnosis not present

## 2024-04-24 DIAGNOSIS — H2513 Age-related nuclear cataract, bilateral: Secondary | ICD-10-CM | POA: Diagnosis not present

## 2024-04-24 DIAGNOSIS — H25043 Posterior subcapsular polar age-related cataract, bilateral: Secondary | ICD-10-CM | POA: Diagnosis not present

## 2024-04-24 DIAGNOSIS — H18413 Arcus senilis, bilateral: Secondary | ICD-10-CM | POA: Diagnosis not present

## 2024-05-19 DIAGNOSIS — H353221 Exudative age-related macular degeneration, left eye, with active choroidal neovascularization: Secondary | ICD-10-CM | POA: Diagnosis not present

## 2024-05-19 DIAGNOSIS — H43813 Vitreous degeneration, bilateral: Secondary | ICD-10-CM | POA: Diagnosis not present

## 2024-05-19 DIAGNOSIS — H2513 Age-related nuclear cataract, bilateral: Secondary | ICD-10-CM | POA: Diagnosis not present

## 2024-05-19 DIAGNOSIS — H353112 Nonexudative age-related macular degeneration, right eye, intermediate dry stage: Secondary | ICD-10-CM | POA: Diagnosis not present

## 2024-05-20 DIAGNOSIS — Z9889 Other specified postprocedural states: Secondary | ICD-10-CM | POA: Diagnosis not present

## 2024-05-21 DIAGNOSIS — H2589 Other age-related cataract: Secondary | ICD-10-CM | POA: Diagnosis not present

## 2024-05-21 DIAGNOSIS — H2513 Age-related nuclear cataract, bilateral: Secondary | ICD-10-CM | POA: Diagnosis not present

## 2024-06-02 DIAGNOSIS — H353221 Exudative age-related macular degeneration, left eye, with active choroidal neovascularization: Secondary | ICD-10-CM | POA: Diagnosis not present

## 2024-06-08 DIAGNOSIS — H2513 Age-related nuclear cataract, bilateral: Secondary | ICD-10-CM | POA: Diagnosis not present

## 2024-06-08 DIAGNOSIS — H2512 Age-related nuclear cataract, left eye: Secondary | ICD-10-CM | POA: Diagnosis not present

## 2024-06-15 DIAGNOSIS — M810 Age-related osteoporosis without current pathological fracture: Secondary | ICD-10-CM | POA: Diagnosis not present

## 2024-06-15 DIAGNOSIS — E7800 Pure hypercholesterolemia, unspecified: Secondary | ICD-10-CM | POA: Diagnosis not present

## 2024-06-15 DIAGNOSIS — I2510 Atherosclerotic heart disease of native coronary artery without angina pectoris: Secondary | ICD-10-CM | POA: Diagnosis not present

## 2024-06-17 DIAGNOSIS — I2510 Atherosclerotic heart disease of native coronary artery without angina pectoris: Secondary | ICD-10-CM | POA: Diagnosis not present

## 2024-06-24 DIAGNOSIS — L578 Other skin changes due to chronic exposure to nonionizing radiation: Secondary | ICD-10-CM | POA: Diagnosis not present

## 2024-06-24 DIAGNOSIS — L821 Other seborrheic keratosis: Secondary | ICD-10-CM | POA: Diagnosis not present

## 2024-06-24 DIAGNOSIS — L2089 Other atopic dermatitis: Secondary | ICD-10-CM | POA: Diagnosis not present

## 2024-06-24 DIAGNOSIS — L814 Other melanin hyperpigmentation: Secondary | ICD-10-CM | POA: Diagnosis not present

## 2024-06-24 DIAGNOSIS — Z08 Encounter for follow-up examination after completed treatment for malignant neoplasm: Secondary | ICD-10-CM | POA: Diagnosis not present

## 2024-06-24 DIAGNOSIS — Z85828 Personal history of other malignant neoplasm of skin: Secondary | ICD-10-CM | POA: Diagnosis not present

## 2024-06-24 DIAGNOSIS — D225 Melanocytic nevi of trunk: Secondary | ICD-10-CM | POA: Diagnosis not present

## 2024-06-24 DIAGNOSIS — L570 Actinic keratosis: Secondary | ICD-10-CM | POA: Diagnosis not present

## 2024-06-25 ENCOUNTER — Encounter: Payer: Self-pay | Admitting: Diagnostic Neuroimaging

## 2024-07-01 ENCOUNTER — Telehealth: Payer: Self-pay

## 2024-07-01 ENCOUNTER — Telehealth: Admitting: Diagnostic Neuroimaging

## 2024-07-01 DIAGNOSIS — G629 Polyneuropathy, unspecified: Secondary | ICD-10-CM

## 2024-07-01 NOTE — Telephone Encounter (Signed)
 Cld Pt to make her aware provider is in agreement to [facility] VV today at 3:00 pm

## 2024-07-01 NOTE — Progress Notes (Unsigned)
 [facility]  PATIENT: Susan Norton DOB: [birth year]  REFERRING CLINICIAN: Cleotilde Planas, MD HISTORY FROM: patient  REASON FOR VISIT: follow up   HISTORICAL  CHIEF COMPLAINT:  No chief complaint on file.   HISTORY OF PRESENT ILLNESS:   UPDATE (08/05/23, VRP): Since last visit, doing well. On botox for ET (outside of trial). Tolerating meds. Only taking once daily dosing of propranolol  and primidone .   UPDATE (08/06/22 , VRP): Since last visit, doing about the same. Now on clinical trial for ET with botox. Neuropathy stable --> some tight feeling in feet. No major pain.   UPDATE (12/04/21, VRP): Since last visit, doing about the same. No change in tremor with meds. Had 2nd opinion with Dr. Rosalia at [facility]. ET confirmed. Offered DBS vs research study. Interested in neuromodulation device. Also with some numbness in toes t hat is new.   UPDATE (12/15/18, VRP): Since last visit, tremor has progressed in BUE (postural and action). Tolerating primidone  100mg  twice a day. No other alleviating or aggravating factors.     UPDATE 04/09/17: Since last visit, doing about the same. On primidone  50mg  BID. No side effects. Tremor mainly affects pt when she is measuring with cooking or applying makeup.    PRIOR HPI (12/26/16): 73 year old female here for evaluation of tremor. Patient has had postural and action tremor since age 29 years old. Patient's father had some tremor and several family members on her father's side had tremor. Patient was diagnosed with possible essential tremor. Over the last few years symptoms have slightly worsened. She denies any voice tremor or leg tremor. Tremor mainly affects her hands. Symptoms are worse when she is tired or nervous. This affects her when she is reaching for things, holding utensils or handwriting. Patient tried propranolol  40 mg daily for 1-2 months without relief. Patient has normal blood pressure but tends to run slightly on the low  side.  REVIEW OF SYSTEMS: Full 14 system review of systems performed and negative with exception of: as per HPI.  ALLERGIES: No Known Allergies  HOME MEDICATIONS: Outpatient Medications Prior to Visit  Medication Sig Dispense Refill   Apoaequorin (PREVAGEN EXTRA STRENGTH) 20 MG CAPS      Boswellia-Glucosamine-Vit D (OSTEO BI-FLEX ONE PER DAY PO) Take by mouth.     Cholecalciferol (VITAMIN D3) 50 MCG (2000 UT) TABS Take by mouth.     Multiple Vitamins-Minerals (MULTIVITAMIN WITH MINERALS) tablet Take 1 tablet by mouth daily.     Multiple Vitamins-Minerals (PRESERVISION AREDS 2+MULTI VIT PO)      primidone  (MYSOLINE ) 50 MG tablet Take 3 tablets (150 mg total) by mouth daily. 270 tablet 4   propranolol  (INDERAL ) 40 MG tablet Take 1 tablet (40 mg total) by mouth daily. 90 tablet 4   No facility-administered medications prior to visit.      PHYSICAL EXAM  Video visit     DIAGNOSTIC DATA (LABS, IMAGING, TESTING) - I reviewed patient records, labs, notes, testing and imaging myself where available.  Lab Results  Component Value Date   WBC 7.1 01/11/2022   HGB 13.4 01/11/2022   HCT 39.8 01/11/2022   MCV 100 (H) 01/11/2022   PLT 183 01/11/2022      Component Value Date/Time   NA 144 01/11/2022 1640   K 4.4 01/11/2022 1640   CL 106 01/11/2022 1640   CO2 24 01/11/2022 1640   GLUCOSE 95 01/11/2022 1640   BUN 15 01/11/2022 1640   CREATININE 0.68 01/11/2022 1640   CALCIUM  9.0 01/11/2022 1640   PROT 6.5 01/11/2022 1640   ALBUMIN 4.0 01/11/2022 1640   AST 19 01/11/2022 1640   ALT 15 01/11/2022 1640   ALKPHOS 65 01/11/2022 1640   BILITOT 0.2 01/11/2022 1640   No results found for: CHOL, HDL, LDLCALC, LDLDIRECT, TRIG, CHOLHDL Lab Results  Component Value Date   HGBA1C 5.8 (H) 01/11/2022   Lab Results  Component Value Date   VITAMINB12 617 01/11/2022   Lab Results  Component Value Date   TSH 1.380 01/11/2022    01/11/22 EMG/NCS Abnormal study  demonstrating: - Moderate to severe, axonal sensorimotor polyneuropathy.    ASSESSMENT AND PLAN  73 y.o. year old female here with:  Dx:  No diagnosis found.     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  Return in about 6 months (around 01/01/2025) for MyChart visit (15 min).  Virtual Visit via Video Note  I connected with Susan Norton on 07/01/24 at  3:00 PM EDT by a video enabled telemedicine application and verified that I am speaking with the correct person using two identifiers.   I discussed the limitations of evaluation and management by telemedicine and the availability of in person appointments. The patient expressed understanding and agreed to proceed.  Patient is at home and I am at the office.   I spent 15 minutes of face-to-face and non-face-to-face time with patient.  This included previsit chart review, lab review, study review, order entry, electronic health record documentation, patient education.      EDUARD FABIENE HANLON, MD 07/01/2024, 3:21 PM Certified in Neurology, Neurophysiology and Neuroimaging  [facility] [address] [place], [place] [address] [phone]

## 2024-07-01 NOTE — Telephone Encounter (Signed)
 Spoke w/Pt regarding provider request for VV. Pt stated she is available this afternoon at 3:00 pm. Messaged provider, will call Pt when provider responds.

## 2024-07-02 ENCOUNTER — Encounter: Payer: Self-pay | Admitting: Diagnostic Neuroimaging

## 2024-07-02 MED ORDER — GABAPENTIN 100 MG PO CAPS
100.0000 mg | ORAL_CAPSULE | Freq: Every day | ORAL | 6 refills | Status: DC
Start: 2024-07-02 — End: 2024-09-28

## 2024-07-10 DIAGNOSIS — H524 Presbyopia: Secondary | ICD-10-CM | POA: Diagnosis not present

## 2024-07-13 DIAGNOSIS — H353221 Exudative age-related macular degeneration, left eye, with active choroidal neovascularization: Secondary | ICD-10-CM | POA: Diagnosis not present

## 2024-07-13 DIAGNOSIS — H43813 Vitreous degeneration, bilateral: Secondary | ICD-10-CM | POA: Diagnosis not present

## 2024-07-13 DIAGNOSIS — H353211 Exudative age-related macular degeneration, right eye, with active choroidal neovascularization: Secondary | ICD-10-CM | POA: Diagnosis not present

## 2024-07-13 DIAGNOSIS — H353231 Exudative age-related macular degeneration, bilateral, with active choroidal neovascularization: Secondary | ICD-10-CM | POA: Diagnosis not present

## 2024-07-13 DIAGNOSIS — Z961 Presence of intraocular lens: Secondary | ICD-10-CM | POA: Diagnosis not present

## 2024-07-13 DIAGNOSIS — H2513 Age-related nuclear cataract, bilateral: Secondary | ICD-10-CM | POA: Diagnosis not present

## 2024-08-10 DIAGNOSIS — H353231 Exudative age-related macular degeneration, bilateral, with active choroidal neovascularization: Secondary | ICD-10-CM | POA: Diagnosis not present

## 2024-08-11 ENCOUNTER — Other Ambulatory Visit: Payer: Self-pay | Admitting: Diagnostic Neuroimaging

## 2024-08-16 DIAGNOSIS — M810 Age-related osteoporosis without current pathological fracture: Secondary | ICD-10-CM | POA: Diagnosis not present

## 2024-08-16 DIAGNOSIS — I2510 Atherosclerotic heart disease of native coronary artery without angina pectoris: Secondary | ICD-10-CM | POA: Diagnosis not present

## 2024-08-16 DIAGNOSIS — E7800 Pure hypercholesterolemia, unspecified: Secondary | ICD-10-CM | POA: Diagnosis not present

## 2024-09-06 DIAGNOSIS — H6123 Impacted cerumen, bilateral: Secondary | ICD-10-CM | POA: Diagnosis not present

## 2024-09-06 DIAGNOSIS — Z719 Counseling, unspecified: Secondary | ICD-10-CM | POA: Diagnosis not present

## 2024-09-07 DIAGNOSIS — H353231 Exudative age-related macular degeneration, bilateral, with active choroidal neovascularization: Secondary | ICD-10-CM | POA: Diagnosis not present

## 2024-09-09 ENCOUNTER — Other Ambulatory Visit: Payer: Self-pay | Admitting: Diagnostic Neuroimaging

## 2024-09-15 DIAGNOSIS — M810 Age-related osteoporosis without current pathological fracture: Secondary | ICD-10-CM | POA: Diagnosis not present

## 2024-09-15 DIAGNOSIS — E7800 Pure hypercholesterolemia, unspecified: Secondary | ICD-10-CM | POA: Diagnosis not present

## 2024-09-15 DIAGNOSIS — I2510 Atherosclerotic heart disease of native coronary artery without angina pectoris: Secondary | ICD-10-CM | POA: Diagnosis not present

## 2024-09-27 ENCOUNTER — Other Ambulatory Visit: Payer: Self-pay | Admitting: Diagnostic Neuroimaging

## 2024-09-28 NOTE — Telephone Encounter (Signed)
 Last filled by patient on 09/16/24 Last office visit : 07/01/24 video visit  Next office visit : due back 01/01/25

## 2024-09-30 DIAGNOSIS — I2510 Atherosclerotic heart disease of native coronary artery without angina pectoris: Secondary | ICD-10-CM | POA: Diagnosis not present

## 2024-10-06 DIAGNOSIS — H43813 Vitreous degeneration, bilateral: Secondary | ICD-10-CM | POA: Diagnosis not present

## 2024-10-06 DIAGNOSIS — H353231 Exudative age-related macular degeneration, bilateral, with active choroidal neovascularization: Secondary | ICD-10-CM | POA: Diagnosis not present

## 2024-10-06 DIAGNOSIS — H2513 Age-related nuclear cataract, bilateral: Secondary | ICD-10-CM | POA: Diagnosis not present

## 2024-10-06 DIAGNOSIS — Z961 Presence of intraocular lens: Secondary | ICD-10-CM | POA: Diagnosis not present

## 2024-11-08 ENCOUNTER — Other Ambulatory Visit: Payer: Self-pay | Admitting: Diagnostic Neuroimaging

## 2024-11-09 NOTE — Telephone Encounter (Signed)
 Patient returned phone call schedule patient with Dr. Margaret on 07/06/25 at 3:30 pm

## 2025-07-06 ENCOUNTER — Ambulatory Visit: Admitting: Diagnostic Neuroimaging
